# Patient Record
Sex: Female | Born: 1995 | Race: Black or African American | Hispanic: No | Marital: Single | Smoking: Current every day smoker
Health system: Southern US, Community
[De-identification: ages and names within clinical notes are randomized; demographics above are authoritative.]

## PROBLEM LIST (undated history)

## (undated) DIAGNOSIS — N83209 Unspecified ovarian cyst, unspecified side: Secondary | ICD-10-CM

## (undated) DIAGNOSIS — F419 Anxiety disorder, unspecified: Secondary | ICD-10-CM

## (undated) DIAGNOSIS — N730 Acute parametritis and pelvic cellulitis: Secondary | ICD-10-CM

---

## 2015-03-02 ENCOUNTER — Emergency Department (HOSPITAL_COMMUNITY): Payer: Self-pay

## 2015-03-02 ENCOUNTER — Encounter (HOSPITAL_COMMUNITY): Payer: Self-pay | Admitting: *Deleted

## 2015-03-02 ENCOUNTER — Emergency Department (HOSPITAL_COMMUNITY)
Admission: EM | Admit: 2015-03-02 | Discharge: 2015-03-02 | Disposition: A | Discharge status: Home / Self Care | Payer: Self-pay | Attending: Emergency Medicine | Admitting: Emergency Medicine

## 2015-03-02 DIAGNOSIS — N390 Urinary tract infection, site not specified: Secondary | ICD-10-CM | POA: Insufficient documentation

## 2015-03-02 DIAGNOSIS — R112 Nausea with vomiting, unspecified: Secondary | ICD-10-CM | POA: Insufficient documentation

## 2015-03-02 DIAGNOSIS — N730 Acute parametritis and pelvic cellulitis: Secondary | ICD-10-CM

## 2015-03-02 DIAGNOSIS — R102 Pelvic and perineal pain: Secondary | ICD-10-CM

## 2015-03-02 DIAGNOSIS — Z8659 Personal history of other mental and behavioral disorders: Secondary | ICD-10-CM | POA: Insufficient documentation

## 2015-03-02 DIAGNOSIS — N83201 Unspecified ovarian cyst, right side: Secondary | ICD-10-CM

## 2015-03-02 DIAGNOSIS — N8301 Follicular cyst of right ovary: Secondary | ICD-10-CM | POA: Insufficient documentation

## 2015-03-02 DIAGNOSIS — R1030 Lower abdominal pain, unspecified: Secondary | ICD-10-CM | POA: Insufficient documentation

## 2015-03-02 DIAGNOSIS — N739 Female pelvic inflammatory disease, unspecified: Secondary | ICD-10-CM | POA: Insufficient documentation

## 2015-03-02 DIAGNOSIS — Z3202 Encounter for pregnancy test, result negative: Secondary | ICD-10-CM | POA: Insufficient documentation

## 2015-03-02 DIAGNOSIS — Z720 Tobacco use: Secondary | ICD-10-CM | POA: Insufficient documentation

## 2015-03-02 DIAGNOSIS — R197 Diarrhea, unspecified: Secondary | ICD-10-CM | POA: Insufficient documentation

## 2015-03-02 HISTORY — DX: Anxiety disorder, unspecified: F41.9

## 2015-03-02 LAB — I-STAT BETA HCG BLOOD, ED (MC, WL, AP ONLY): I-stat hCG, quantitative: <5 m[IU]/mL (ref <5)

## 2015-03-02 LAB — URINALYSIS, ROUTINE W REFLEX MICROSCOPIC
Bilirubin Urine: NEGATIVE
Glucose, UA: NEGATIVE mg/dL
Hgb urine dipstick: NEGATIVE
KETONES UR: 15 mg/dL — AB
NITRITE: NEGATIVE
PROTEIN: NEGATIVE mg/dL
Specific Gravity, Urine: 1.027 (ref 1.005–1.030)
Urobilinogen, UA: 1 mg/dL (ref 0.0–1.0)
pH: 6.5 (ref 5.0–8.0)

## 2015-03-02 LAB — URINE MICROSCOPIC-ADD ON

## 2015-03-02 LAB — CBC
HCT: 44.9 % (ref 36.0–46.0)
HEMOGLOBIN: 15.1 g/dL — AB (ref 12.0–15.0)
MCH: 29.6 pg (ref 26.0–34.0)
MCHC: 33.6 g/dL (ref 30.0–36.0)
MCV: 88 fL (ref 78.0–100.0)
PLATELETS: 303 10*3/uL (ref 150–400)
RBC: 5.1 MIL/uL (ref 3.87–5.11)
RDW: 13.7 % (ref 11.5–15.5)
WBC: 7.8 10*3/uL (ref 4.0–10.5)

## 2015-03-02 LAB — COMPREHENSIVE METABOLIC PANEL
ALK PHOS: 68 U/L (ref 38–126)
ALT: 25 U/L (ref 14–54)
ANION GAP: 8 (ref 5–15)
AST: 27 U/L (ref 15–41)
Albumin: 4 g/dL (ref 3.5–5.0)
BUN: <5 mg/dL — ABNORMAL LOW (ref 6–20)
CALCIUM: 9.3 mg/dL (ref 8.9–10.3)
CHLORIDE: 107 mmol/L (ref 101–111)
CO2: 24 mmol/L (ref 22–32)
CREATININE: 0.78 mg/dL (ref 0.44–1.00)
Glucose, Bld: 111 mg/dL — ABNORMAL HIGH (ref 65–99)
Potassium: 3.8 mmol/L (ref 3.5–5.1)
SODIUM: 139 mmol/L (ref 135–145)
Total Bilirubin: 0.5 mg/dL (ref 0.3–1.2)
Total Protein: 7.5 g/dL (ref 6.5–8.1)

## 2015-03-02 LAB — WET PREP, GENITAL
Clue Cells Wet Prep HPF POC: NONE SEEN
TRICH WET PREP: NONE SEEN
Yeast Wet Prep HPF POC: NONE SEEN

## 2015-03-02 LAB — LIPASE, BLOOD: LIPASE: 36 U/L (ref 11–51)

## 2015-03-02 MED ORDER — DOXYCYCLINE HYCLATE 100 MG PO CAPS: 100.0000 mg | ORAL_CAPSULE | Freq: Two times a day (BID) | ORAL | Status: DC

## 2015-03-02 MED ORDER — AZITHROMYCIN 250 MG PO TABS
1000.0000 mg | ORAL_TABLET | Freq: Once | ORAL | Status: AC
  Administered 2015-03-02: 1000 mg via ORAL
  Filled 2015-03-02: qty 4

## 2015-03-02 MED ORDER — CEFTRIAXONE SODIUM 1 G IJ SOLR
1.0000 g | Freq: Once | INTRAMUSCULAR | Status: AC
  Administered 2015-03-02: 1 g via INTRAVENOUS
  Filled 2015-03-02: qty 10

## 2015-03-02 MED ORDER — NAPROXEN 500 MG PO TABS: 500.0000 mg | ORAL_TABLET | Freq: Two times a day (BID) | ORAL | Status: DC | PRN

## 2015-03-02 MED ORDER — SODIUM CHLORIDE 0.9 % IV BOLUS (SEPSIS)
1000.0000 mL | Freq: Once | INTRAVENOUS | Status: AC
Start: 2015-03-02 — End: 2015-03-02
  Administered 2015-03-02: 1000 mL via INTRAVENOUS

## 2015-03-02 MED ORDER — ONDANSETRON HCL 8 MG PO TABS: 8.0000 mg | ORAL_TABLET | Freq: Three times a day (TID) | ORAL | Status: DC | PRN

## 2015-03-02 MED ORDER — MORPHINE SULFATE (PF) 4 MG/ML IV SOLN
4.0000 mg | Freq: Once | INTRAVENOUS | Status: AC
  Administered 2015-03-02: 4 mg via INTRAVENOUS
  Filled 2015-03-02: qty 1

## 2015-03-02 MED ORDER — CEPHALEXIN 500 MG PO CAPS: ORAL_CAPSULE | ORAL | Status: DC

## 2015-03-02 MED ORDER — HYDROCODONE-ACETAMINOPHEN 5-325 MG PO TABS: 1.0000 | ORAL_TABLET | Freq: Four times a day (QID) | ORAL | Status: DC | PRN

## 2015-03-02 MED ORDER — ONDANSETRON HCL 4 MG/2ML IJ SOLN
4.0000 mg | Freq: Once | INTRAMUSCULAR | Status: AC
  Administered 2015-03-02: 4 mg via INTRAVENOUS
  Filled 2015-03-02: qty 2

## 2015-03-02 NOTE — ED Notes (Signed)
Pt is requesting pain medication. [place], [place] informed.

## 2015-03-02 NOTE — ED Notes (Signed)
Pt reports left side pain x 3 weeks, having urinary frequency and chills.

## 2015-03-02 NOTE — Discharge Instructions (Signed)
Your work up revealed an ovarian cyst on the right ovary which could be causing your symptoms. Take naprosyn and norco as directed as needed for pain but don't drive while taking norco. Use zofran as directed as needed for nausea. Stay well hydrated. Your urine appears to have a possible infection, take keflex as directed for this. Your vaginal exam revealed discharge and tenderness, you are being treated for pelvic inflammatory disease, take doxycycline as directed. You have been treated for gonorrhea and chlamydia in the ER but the hospital will call you if lab is positive. You were tested for HIV and Syphilis, and the hospital will call you if the lab is positive. Always use protection when engaging in intercourse. Follow up with [facility] in 1 week for recheck of symptoms and ongoing medical care, and [facility] for ongoing management of your ovarian cyst. Return to the ER for changes or worsening symptoms.  Abdominal (belly) pain can be caused by many things. Your caregiver performed an examination and possibly ordered blood/urine tests and imaging (CT scan, x-rays, ultrasound). Many cases can be observed and treated at home after initial evaluation in the emergency department. Even though you are being discharged home, abdominal pain can be unpredictable. Therefore, you need a repeated exam if your pain does not resolve, returns, or worsens. Most patients with abdominal pain don't have to be admitted to the hospital or have surgery, but serious problems like appendicitis and gallbladder attacks can start out as nonspecific pain. Many abdominal conditions cannot be diagnosed in one visit, so follow-up evaluations are very important. SEEK IMMEDIATE MEDICAL ATTENTION IF YOU DEVELOP ANY OF THE FOLLOWING SYMPTOMS:  The pain does not go away or becomes severe.   A temperature above 101 develops.   Repeated vomiting occurs (multiple episodes).   The pain  becomes localized to portions of the abdomen. The right side could possibly be appendicitis. In an adult, the left lower portion of the abdomen could be colitis or diverticulitis.   Blood is being passed in stools or vomit (bright red or black tarry stools).   Return also if you develop chest pain, difficulty breathing, dizziness or fainting, or become confused, poorly responsive, or inconsolable (young children).  The constipation stays for more than 4 days.   There is belly (abdominal) or rectal pain.   You do not seem to be getting better.    Stay very well hydrated with plenty of water throughout the day. Take antibiotic until completed. May consider over-the-counter Pyridium for pain relief, but don't take this longer than 3 days, and be aware that it may turn your urine bright orange. This is a harmless side effect. Follow up with primary care physician in 1 week for recheck of ongoing symptoms but return to ER for emergent changing or worsening of symptoms. Please seek immediate care if you develop the following: You develop back pain.  Your symptoms are no better, or worse in 3 days. There is severe back pain or lower abdominal pain.  You develop chills.  You have a fever.  There is nausea or vomiting.  There is continued burning or discomfort with urination.     Abdominal Pain, Adult Many things can cause belly (abdominal) pain. Most times, the belly pain is not dangerous. Many cases of belly pain can be watched and treated at home. HOME CARE   Do not take medicines that help you go poop (laxatives) unless told to  by your doctor.  Only take medicine as told by your doctor.  Eat or drink as told by your doctor. Your doctor will tell you if you should be on a special diet. GET HELP IF:  You do not know what is causing your belly pain.  You have belly pain while you are sick to your stomach (nauseous) or have runny poop (diarrhea).  You have pain while you pee or  poop.  Your belly pain wakes you up at night.  You have belly pain that gets worse or better when you eat.  You have belly pain that gets worse when you eat fatty foods.  You have a fever. GET HELP RIGHT AWAY IF:   The pain does not go away within 2 hours.  You keep throwing up (vomiting).  The pain changes and is only in the right or left part of the belly.  You have bloody or tarry looking poop. MAKE SURE YOU:   Understand these instructions.  Will watch your condition.  Will get help right away if you are not doing well or get worse.   This information is not intended to replace advice given to you by your health care provider. Make sure you discuss any questions you have with your health care provider.   Document Released: 10/01/2007 Document Revised: 05/05/2014 Document Reviewed: 12/22/2012 Elsevier Interactive Patient Education 2016 [employer] Inc.  Nausea and Vomiting Nausea means you feel sick to your stomach. Throwing up (vomiting) is a reflex where stomach contents come out of your mouth. HOME CARE   Take medicine as told by your doctor.  Do not force yourself to eat. However, you do need to drink fluids.  If you feel like eating, eat a normal diet as told by your doctor.  Eat rice, wheat, potatoes, bread, lean meats, yogurt, fruits, and vegetables.  Avoid high-fat foods.  Drink enough fluids to keep your pee (urine) clear or pale yellow.  Ask your doctor how to replace body fluid losses (rehydrate). Signs of body fluid loss (dehydration) include:  Feeling very thirsty.  Dry lips and mouth.  Feeling dizzy.  Dark pee.  Peeing less than normal.  Feeling confused.  Fast breathing or heart rate. GET HELP RIGHT AWAY IF:   You have blood in your throw up.  You have black or bloody poop (stool).  You have a bad headache or stiff neck.  You feel confused.  You have bad belly (abdominal) pain.  You have chest pain or trouble breathing.  You  do not pee at least once every 8 hours.  You have cold, clammy skin.  You keep throwing up after 24 to 48 hours.  You have a fever. MAKE SURE YOU:   Understand these instructions.  Will watch your condition.  Will get help right away if you are not doing well or get worse.   This information is not intended to replace advice given to you by your health care provider. Make sure you discuss any questions you have with your health care provider.   Document Released: 10/01/2007 Document Revised: 07/07/2011 Document Reviewed: 09/13/2010 Elsevier Interactive Patient Education 2016 [employer].  Food Choices to Help Relieve Diarrhea, Adult When you have diarrhea, the foods you eat and your eating habits are very important. Choosing the right foods and drinks can help relieve diarrhea. Also, because diarrhea can last up to 7 days, you need to replace lost fluids and electrolytes (such as sodium, potassium, and chloride) in order to  help prevent dehydration.  WHAT GENERAL GUIDELINES DO I NEED TO FOLLOW?  Slowly drink 1 cup (8 oz) of fluid for each episode of diarrhea. If you are getting enough fluid, your urine will be clear or pale yellow.  Eat starchy foods. Some good choices include white rice, white toast, pasta, low-fiber cereal, baked potatoes (without the skin), saltine crackers, and bagels.  Avoid large servings of any cooked vegetables.  Limit fruit to two servings per day. A serving is  cup or 1 small piece.  Choose foods with less than 2 g of fiber per serving.  Limit fats to less than 8 tsp (38 g) per day.  Avoid fried foods.  Eat foods that have probiotics in them. Probiotics can be found in certain dairy products.  Avoid foods and beverages that may increase the speed at which food moves through the stomach and intestines (gastrointestinal tract). Things to avoid include:  High-fiber foods, such as dried fruit, raw fruits and vegetables, nuts, seeds, and whole grain  foods.  Spicy foods and high-fat foods.  Foods and beverages sweetened with high-fructose corn syrup, honey, or sugar alcohols such as xylitol, sorbitol, and mannitol. WHAT FOODS ARE RECOMMENDED? Grains White rice. White, [place], or pita breads (fresh or toasted), including plain rolls, buns, or bagels. White pasta. Saltine, soda, or graham crackers. Pretzels. Low-fiber cereal. Cooked cereals made with water (such as cornmeal, farina, or cream cereals). Plain muffins. Matzo. Melba toast. Zwieback.  Vegetables Potatoes (without the skin). Strained tomato and vegetable juices. Most well-cooked and canned vegetables without seeds. Tender lettuce. Fruits Cooked or canned applesauce, apricots, cherries, fruit cocktail, grapefruit, peaches, pears, or plums. Fresh bananas, apples without skin, cherries, grapes, cantaloupe, grapefruit, peaches, oranges, or plums.  Meat and Other Protein Products Baked or boiled chicken. Eggs. Tofu. Fish. Seafood. Smooth peanut butter. Ground or well-cooked tender beef, ham, veal, lamb, pork, or poultry.  Dairy Plain yogurt, kefir, and unsweetened liquid yogurt. Lactose-free milk, buttermilk, or soy milk. Plain hard cheese. Beverages Sport drinks. Clear broths. Diluted fruit juices (except prune). Regular, caffeine-free sodas such as ginger ale. Water. Decaffeinated teas. Oral rehydration solutions. Sugar-free beverages not sweetened with sugar alcohols. Other Bouillon, broth, or soups made from recommended foods.  The items listed above may not be a complete list of recommended foods or beverages. Contact your dietitian for more options. WHAT FOODS ARE NOT RECOMMENDED? Grains Whole grain, whole wheat, bran, or rye breads, rolls, pastas, crackers, and cereals. Wild or brown rice. Cereals that contain more than 2 g of fiber per serving. Corn tortillas or taco shells. Cooked or dry oatmeal. Granola. Popcorn. Vegetables Raw vegetables. Cabbage, broccoli, Brussels  sprouts, artichokes, baked beans, beet greens, corn, kale, legumes, peas, sweet potatoes, and yams. Potato skins. Cooked spinach and cabbage. Fruits Dried fruit, including raisins and dates. Raw fruits. Stewed or dried prunes. Fresh apples with skin, apricots, mangoes, pears, raspberries, and strawberries.  Meat and Other Protein Products Chunky peanut butter. Nuts and seeds. Beans and lentils. Tiffany Pennington.  Dairy High-fat cheeses. Milk, chocolate milk, and beverages made with milk, such as milk shakes. Cream. Ice cream. Sweets and Desserts Sweet rolls, doughnuts, and sweet breads. Pancakes and waffles. Fats and Oils Butter. Cream sauces. Margarine. Salad oils. Plain salad dressings. Olives. Avocados.  Beverages Caffeinated beverages (such as coffee, tea, soda, or energy drinks). Alcoholic beverages. Fruit juices with pulp. Prune juice. Soft drinks sweetened with high-fructose corn syrup or sugar alcohols. Other Coconut. Hot sauce. Chili powder. Mayonnaise. Gravy. Cream-based or  milk-based soups.  The items listed above may not be a complete list of foods and beverages to avoid. Contact your dietitian for more information. WHAT SHOULD I DO IF I BECOME DEHYDRATED? Diarrhea can sometimes lead to dehydration. Signs of dehydration include dark urine and dry mouth and skin. If you think you are dehydrated, you should rehydrate with an oral rehydration solution. These solutions can be purchased at pharmacies, retail stores, or online.  Drink -1 cup (120-240 mL) of oral rehydration solution each time you have an episode of diarrhea. If drinking this amount makes your diarrhea worse, try drinking smaller amounts more often. For example, drink 1-3 tsp (5-15 mL) every 5-10 minutes.  A general rule for staying hydrated is to drink 1-2 L of fluid per day. Talk to your health care provider about the specific amount you should be drinking each day. Drink enough fluids to keep your urine clear or pale yellow.    This information is not intended to replace advice given to you by your health care provider. Make sure you discuss any questions you have with your health care provider.   Document Released: 07/05/2003 Document Revised: 05/05/2014 Document Reviewed: 03/07/2013 Elsevier Interactive Patient Education 2016 Elsevier Inc.  Pelvic Inflammatory Disease Pelvic inflammatory disease (PID) refers to an infection in some or all of the female organs. The infection can be in the uterus, ovaries, fallopian tubes, or the surrounding tissues in the pelvis. PID can cause abdominal or pelvic pain that comes on suddenly (acute pelvic pain). PID is a serious infection because it can lead to lasting (chronic) pelvic pain or the inability to have children (infertility). CAUSES This condition is most often caused by an infection that is spread during sexual contact. However, the infection can also be caused by the normal bacteria that are found in the vaginal tissues if these bacteria travel upward into the reproductive organs. PID can also occur following:  The birth of a baby.  A miscarriage.  An abortion.  Major pelvic surgery.  The use of an intrauterine device (IUD).  A sexual assault. RISK FACTORS This condition is more likely to develop in women who:  Are younger than 20 years of age.  [facility].  Use nonbarrier contraception.  Have multiple sexual partners.  Have sex with someone who has symptoms of an STD (sexually transmitted disease).  Use oral contraception. At times, certain behaviors can also increase the possibility of getting PID, such as:  Using a vaginal douche.  Having an IUD in place. SYMPTOMS Symptoms of this condition include:  Abdominal or pelvic pain.  Fever.  Chills.  Abnormal vaginal discharge.  Abnormal uterine bleeding.  Unusual pain shortly after the end of a menstrual period.  Painful urination.  Pain with sexual  intercourse.  Nausea and vomiting. DIAGNOSIS To diagnose this condition, your health care provider will do a physical exam and take your medical history. A pelvic exam typically reveals great tenderness in the uterus and the surrounding pelvic tissues. You may also have tests, such as:  Lab tests, including a pregnancy test, blood tests, and urine test.  Culture tests of the vagina and cervix to check for an STD.  Ultrasound.  A laparoscopic procedure to look inside the pelvis.  Examining vaginal secretions under a microscope. TREATMENT Treatment for this condition may involve one or more approaches.  Antibiotic medicines may be prescribed to be taken by mouth.  Sexual partners may need to be treated if the  infection is caused by an STD.  For more severe cases, hospitalization may be needed to give antibiotics directly into a vein through an IV tube.  Surgery may be needed if other treatments do not help, but this is rare. It may take weeks until you are completely well. If you are diagnosed with PID, you should also be checked for human immunodeficiency virus (HIV). Your health care provider may test you for infection again 3 months after treatment. You should not have unprotected sex. HOME CARE INSTRUCTIONS  Take over-the-counter and prescription medicines only as told by your health care provider.  If you were prescribed an antibiotic medicine, take it as told by your health care provider. Do not stop taking the antibiotic even if you start to feel better.  Do not have sexual intercourse until treatment is completed or as told by your health care provider. If PID is confirmed, your recent sexual partners will need treatment, especially if you had unprotected sex.  Keep all follow-up visits as told by your health care provider. This is important. SEEK MEDICAL CARE IF:  You have increased or abnormal vaginal discharge.  Your pain does not improve.  You vomit.  You have a  fever.  You cannot tolerate your medicines.  Your partner has an STD.  You have pain when you urinate. SEEK IMMEDIATE MEDICAL CARE IF:  You have increased abdominal or pelvic pain.  You have chills.  Your symptoms are not better in 72 hours even with treatment.   This information is not intended to replace advice given to you by your health care provider. Make sure you discuss any questions you have with your health care provider.   Document Released: 04/14/2005 Document Revised: 01/03/2015 Document Reviewed: 05/22/2014 Elsevier Interactive Patient Education 2016 Else[employer] Inc.  Urinary Tract Infection A urinary tract infection (UTI) can occur any place along the urinary tract. The tract includes the kidneys, ureters, bladder, and urethra. A type of germ called bacteria often causes a UTI. UTIs are often helped with antibiotic medicine.  HOME CARE   If given, take antibiotics as told by your doctor. Finish them even if you start to feel better.  Drink enough fluids to keep your pee (urine) clear or pale yellow.  Avoid tea, drinks with caffeine, and bubbly (carbonated) drinks.  Pee often. Avoid holding your pee in for a long time.  Pee before and after having sex (intercourse).  Wipe from front to back after you poop (bowel movement) if you are a woman. Use each tissue only once. GET HELP RIGHT AWAY IF:   You have back pain.  You have lower belly (abdominal) pain.  You have chills.  You feel sick to your stomach (nauseous).  You throw up (vomit).  Your burning or discomfort with peeing does not go away.  You have a fever.  Your symptoms are not better in 3 days. MAKE SURE YOU:   Understand these instructions.  Will watch your condition.  Will get help right away if you are not doing well or get worse.   This information is not intended to replace advice given to you by your health care provider. Make sure you discuss any questions you have with your health  care provider.   Document Released: 10/01/2007 Document Revised: 05/05/2014 Document Reviewed: 11/13/2011 Elsevier Interactive Patient Education [employer]

## 2015-03-02 NOTE — ED Notes (Signed)
Pt unable to obtain urine sample at triage. 

## 2015-03-02 NOTE — ED Provider Notes (Signed)
CSN: [id number]     Arrival date & time 03/02/15  1224 History   First MD Initiated Contact with Patient 03/02/15 1458     Chief Complaint  Patient presents with  . Abdominal Pain  . Urinary Frequency     (Consider location/radiation/quality/duration/timing/severity/associated sxs/prior Treatment) HPI Comments: Tiffany Pennington is a 19 y.o. female with a PMHx of anxiety and L ovarian cyst, who presents to the ED with complaints of 3 weeks of gradual onset lower abdominal pain. She reports the pain is 10/10 constant waxing and waning cramping worse in the left lower quadrant and suprapubic area, but somewhat in the right lower quadrant as well, radiating to her L lower back, worse with movement, and unrelieved with Aleve, ibuprofen, and Tylenol. Associated symptoms include chills, dysuria, urinary frequency and urgency. She also reports nausea, one episode of nonbloody nonbilious emesis, and one episode of watery nonbloody diarrhea last night. She endorses chronic NSAID use. She states that her LMP was 1 year ago due to the fact that she had Nexplanon placed, states that she had this done because she had severe cramping with her menses. She is sexually active with one female partner, unprotected.  She denies any fevers, chest pain, shortness breath, hematemesis, hematochezia, melena, constipation, obstipation, hematuria, vaginal bleeding or discharge, numbness, tingling, weakness, recent travel, suspicious food intake, sick contacts, recent antibiotics, alcohol use, or prior abdominal surgeries. She states this pain is different than her prior abd cramping with menses.  Patient is a 19 y.o. female presenting with abdominal pain and frequency. The history is provided by the patient. No language interpreter was used.  Abdominal Pain Pain location:  LLQ, suprapubic and RLQ Pain quality: cramping   Pain radiates to:  Back Pain severity:  Severe Onset quality:  Gradual Duration:  3 weeks Timing:   Constant Progression:  Waxing and waning Chronicity:  New Context: not recent travel, not sick contacts and not suspicious food intake   Relieved by:  Nothing Worsened by:  Movement Ineffective treatments:  Acetaminophen and NSAIDs Associated symptoms: chills, diarrhea, dysuria, nausea and vomiting   Associated symptoms: no chest pain, no constipation, no fever, no hematuria, no shortness of breath, no vaginal bleeding and no vaginal discharge   Risk factors: NSAID use   Risk factors: no alcohol abuse   Urinary Frequency Associated symptoms include abdominal pain, chills, nausea and vomiting. Pertinent negatives include no arthralgias, chest pain, fever, myalgias, numbness or weakness.    Past Medical History  Diagnosis Date  . Anxiety    No past surgical history on file. No family history on file. Social History  Substance Use Topics  . Smoking status: Current Every Day Smoker    Types: Cigarettes  . Smokeless tobacco: Not on file  . Alcohol Use: No   OB History    No data available     Review of Systems  Constitutional: Positive for chills. Negative for fever.  Respiratory: Negative for shortness of breath.   Cardiovascular: Negative for chest pain.  Gastrointestinal: Positive for nausea, vomiting, abdominal pain and diarrhea. Negative for constipation and blood in stool.  Genitourinary: Positive for dysuria, urgency and frequency. Negative for hematuria, flank pain, vaginal bleeding and vaginal discharge.  Musculoskeletal: Positive for back pain. Negative for myalgias and arthralgias.  Skin: Negative for color change.  Allergic/Immunologic: Negative for immunocompromised state.  Neurological: Negative for weakness and numbness.  Psychiatric/Behavioral: Negative for confusion.   10 Systems reviewed and are negative for acute change except as noted  in the HPI.    Allergies  Review of patient's allergies indicates no known allergies.  Home Medications   Prior to  Admission medications   Not on File   BP 147/57 mmHg  Pulse 74  Temp(Src) 98.3 F (36.8 C) (Oral)  Resp 18  SpO2 100% Physical Exam  Constitutional: She is oriented to person, place, and time. Vital signs are normal. She appears well-developed and well-nourished.  Non-toxic appearance. No distress.  Afebrile, nontoxic, NAD  HENT:  Head: Normocephalic and atraumatic.  Mouth/Throat: Oropharynx is clear and moist and mucous membranes are normal.  Eyes: Conjunctivae and EOM are normal. Right eye exhibits no discharge. Left eye exhibits no discharge.  Neck: Normal range of motion. Neck supple.  Cardiovascular: Normal rate, regular rhythm, normal heart sounds and intact distal pulses.  Exam reveals no gallop and no friction rub.   No murmur heard. Pulmonary/Chest: Effort normal and breath sounds normal. No respiratory distress. She has no decreased breath sounds. She has no wheezes. She has no rhonchi. She has no rales.  Abdominal: Soft. Normal appearance and bowel sounds are normal. She exhibits no distension. There is tenderness in the right lower quadrant, suprapubic area and left lower quadrant. There is no rigidity, no rebound, no guarding, no CVA tenderness, no tenderness at McBurney's point and negative Murphy's sign.    Soft, nondistended, +BS throughout, with diffuse lower abd TTP near pelvic brim, no r/g/r, neg murphy's, neg mcburney's, no CVA TTP   Genitourinary: Uterus normal. Pelvic exam was performed with patient supine. There is no rash, tenderness or lesion on the right labia. There is no rash, tenderness or lesion on the left labia. Cervix exhibits motion tenderness, discharge and friability. Right adnexum displays tenderness. Right adnexum displays no mass and no fullness. Left adnexum displays tenderness and fullness. Left adnexum displays no mass. No erythema or bleeding in the vagina. Vaginal discharge found.  Chaperone present for exam. No rashes, lesions, or tenderness to  external genitalia. No erythema, injury, or tenderness to vaginal mucosa. +Mucoid vaginal discharge without bleeding within vaginal vault. No adnexal masses, but with b/l adnexal tenderness and L sided adnexal fullness. +CMT, cervical friability, and mucoid discharge from cervical os. Uterus non-deviated, mobile, nonTTP, and without enlargement.    Musculoskeletal: Normal range of motion.  Neurological: She is alert and oriented to person, place, and time. She has normal strength. No sensory deficit.  Skin: Skin is warm, dry and intact. No rash noted.  Psychiatric: She has a normal mood and affect.  Nursing note and vitals reviewed.   ED Course  Procedures (including critical care time) Labs Review Labs Reviewed  WET PREP, GENITAL - Abnormal; Notable for the following:    WBC, Wet Prep HPF POC MANY (*)    All other components within normal limits  COMPREHENSIVE METABOLIC PANEL - Abnormal; Notable for the following:    Glucose, Bld 111 (*)    BUN <5 (*)    All other components within normal limits  CBC - Abnormal; Notable for the following:    Hemoglobin 15.1 (*)    All other components within normal limits  URINALYSIS, ROUTINE W REFLEX MICROSCOPIC (NOT AT [facility]) - Abnormal; Notable for the following:    APPearance CLOUDY (*)    Ketones, ur 15 (*)    Leukocytes, UA SMALL (*)    All other components within normal limits  URINE MICROSCOPIC-ADD ON - Abnormal; Notable for the following:    Squamous Epithelial / LPF MANY (*)  Bacteria, UA MANY (*)    All other components within normal limits  LIPASE, BLOOD  RPR  HIV ANTIBODY (ROUTINE TESTING)  I-STAT BETA HCG BLOOD, ED ([facility], [facility], [facility] ONLY)  GC/CHLAMYDIA PROBE AMP ([place]) NOT AT [facility]    Imaging Review US Transvaginal Non-ob  03/02/2015  CLINICAL DATA:  Left adnexal tenderness. EXAM: TRANSABDOMINAL AND TRANSVAGINAL ULTRASOUND OF PELVIS DOPPLER ULTRASOUND OF OVARIES TECHNIQUE: Both transabdominal and transvaginal ultrasound  examinations of the pelvis were performed. Transabdominal technique was performed for global imaging of the pelvis including uterus, ovaries, adnexal regions, and pelvic cul-de-sac. It was necessary to proceed with endovaginal exam following the transabdominal exam to visualize the uterus, endometrium and ovaries. Color and duplex Doppler ultrasound was utilized to evaluate blood flow to the ovaries. COMPARISON:  None. FINDINGS: Uterus Measurements: 6.9 x 3.1 x 4.0 cm. No fibroids or other mass visualized. Endometrium Thickness: 4.0 mm.  No focal abnormality visualized. Right ovary Measurements: 5.3 x 3.1 x 2.5 cm. Cyst measures 3.9 x 2.6 x 2.2 cm. No septation or mural nodularity. No adnexal mass. Left ovary Measurements: 3.6 x 1.7 x 2.7 cm. Normal appearance/no adnexal mass. Pulsed Doppler evaluation of both ovaries demonstrates normal low-resistance arterial and venous waveforms. Other findings Moderate free fluid identified within the pelvis. IMPRESSION: 1. No evidence for ovarian torsion. 2. Right ovary cyst which measures 3.9 cm. This is almost certainly benign, and no specific imaging follow up is recommended according to the Society of Radiologists in Ultrasound2010 Consensus Conference Statement (D Lenis Noon et al. Management of Asymptomatic Ovarian and Other Adnexal Cysts Imaged at [place]: Society of Radiologists in Ultrasound Consensus Conference Statement 2010. Radiology 256 (Sept 2010): 943-954.). 3. Moderate free fluid identified within the pelvis. Electronically Signed   By: Signa Kell M.D.   On: 03/02/2015 18:27   US Pelvis Complete  03/02/2015  CLINICAL DATA:  Left adnexal tenderness. EXAM: TRANSABDOMINAL AND TRANSVAGINAL ULTRASOUND OF PELVIS DOPPLER ULTRASOUND OF OVARIES TECHNIQUE: Both transabdominal and transvaginal ultrasound examinations of the pelvis were performed. Transabdominal technique was performed for global imaging of the pelvis including uterus, ovaries, adnexal regions, and pelvic  cul-de-sac. It was necessary to proceed with endovaginal exam following the transabdominal exam to visualize the uterus, endometrium and ovaries. Color and duplex Doppler ultrasound was utilized to evaluate blood flow to the ovaries. COMPARISON:  None. FINDINGS: Uterus Measurements: 6.9 x 3.1 x 4.0 cm. No fibroids or other mass visualized. Endometrium Thickness: 4.0 mm.  No focal abnormality visualized. Right ovary Measurements: 5.3 x 3.1 x 2.5 cm. Cyst measures 3.9 x 2.6 x 2.2 cm. No septation or mural nodularity. No adnexal mass. Left ovary Measurements: 3.6 x 1.7 x 2.7 cm. Normal appearance/no adnexal mass. Pulsed Doppler evaluation of both ovaries demonstrates normal low-resistance arterial and venous waveforms. Other findings Moderate free fluid identified within the pelvis. IMPRESSION: 1. No evidence for ovarian torsion. 2. Right ovary cyst which measures 3.9 cm. This is almost certainly benign, and no specific imaging follow up is recommended according to the Society of Radiologists in Ultrasound2010 Consensus Conference Statement (D Lenis Noon et al. Management of Asymptomatic Ovarian and Other Adnexal Cysts Imaged at [place]: Society of Radiologists in Ultrasound Consensus Conference Statement 2010. Radiology 256 (Sept 2010): 943-954.). 3. Moderate free fluid identified within the pelvis. Electronically Signed   By: Signa Kell M.D.   On: 03/02/2015 18:27   [place] Art/ven Flow Abd Pelv Doppler  03/02/2015  CLINICAL DATA:  Left adnexal tenderness. EXAM: TRANSABDOMINAL AND TRANSVAGINAL ULTRASOUND OF  PELVIS DOPPLER ULTRASOUND OF OVARIES TECHNIQUE: Both transabdominal and transvaginal ultrasound examinations of the pelvis were performed. Transabdominal technique was performed for global imaging of the pelvis including uterus, ovaries, adnexal regions, and pelvic cul-de-sac. It was necessary to proceed with endovaginal exam following the transabdominal exam to visualize the uterus, endometrium and ovaries. Color and  duplex Doppler ultrasound was utilized to evaluate blood flow to the ovaries. COMPARISON:  None. FINDINGS: Uterus Measurements: 6.9 x 3.1 x 4.0 cm. No fibroids or other mass visualized. Endometrium Thickness: 4.0 mm.  No focal abnormality visualized. Right ovary Measurements: 5.3 x 3.1 x 2.5 cm. Cyst measures 3.9 x 2.6 x 2.2 cm. No septation or mural nodularity. No adnexal mass. Left ovary Measurements: 3.6 x 1.7 x 2.7 cm. Normal appearance/no adnexal mass. Pulsed Doppler evaluation of both ovaries demonstrates normal low-resistance arterial and venous waveforms. Other findings Moderate free fluid identified within the pelvis. IMPRESSION: 1. No evidence for ovarian torsion. 2. Right ovary cyst which measures 3.9 cm. This is almost certainly benign, and no specific imaging follow up is recommended according to the Society of Radiologists in Ultrasound2010 Consensus Conference Statement (D Lenis NoonLevine et al. Management of Asymptomatic Ovarian and Other Adnexal Cysts Imaged at [place]: Society of Radiologists in Ultrasound Consensus Conference Statement 2010. Radiology 256 (Sept 2010): [phone].). 3. Moderate free fluid identified within the pelvis. Electronically Signed   By: Signa Kellaylor  Stroud M.D.   On: 03/02/2015 18:27   I have personally reviewed and evaluated these images and lab results as part of my medical decision-making.   EKG Interpretation None      MDM   Final diagnoses:  Lower abdominal pain  Nausea vomiting and diarrhea  Cyst of right ovary  UTI (lower urinary tract infection)  PID (acute pelvic inflammatory disease)    19 y.o. female here with lower abd pain/cramping x3 wks, and urinary freq/urgency and dysuria, some chills, had n/v/d last night. Hx of ovarian cysts on L ovary. BetaHCG neg, lipase and CMP WNL, CBC unremarkable. Still haven't gotten U/A. On exam, mild diffuse lower abd tenderness, no flank tenderness. Will obtain pelvic, STD check, and wet prep. Could potentially proceed with pelvic  u/s depending on pelvic exam findings. Will give pain meds, nausea meds, and fluids, then reassess.   3:45 PM Pelvic exam reveals mild mucoid discharge in vault, coming from cervix which is friable and has +CMT, b/l adnexal tenderness L>R with some L adnexal fullness. Will obtain pelvic ultrasound to further evaluate pelvic pain. Will empirically treat for GC/CT now. Will reassess after wet prep/UA result, and U/S results. Will likely d/c home with PID tx.  6:06 PM Wet prep with many WBCs. U/A with small leuks, many squamous, 21-50 WBC, and many bacteria, could be contaminant but given her symptoms of dysuria/urinary frequency, will treat as UTI. U/S pending. Pt requesting more pain medications, will give another round of morphine. Will reassess after U/S results.  6:32 PM Pain improving, nausea resolved, tolerating PO well here. U/S with R ovarian cyst measuring 3.9cm with moderate free fluid in pelvis. Could explain her symptoms, although doesn't explain her L adnexal tenderness. Will treat empirically for UTI and PID. Will send home with nausea and pain meds as well, and have her f/up with [facility] in 1wk for recheck as well [facility] on ovarian cyst. I explained the diagnosis and have given explicit precautions to return to the ER including for any other new or worsening symptoms. The patient  understands and accepts the medical plan as it's been dictated and I have answered their questions. Discharge instructions concerning home care and prescriptions have been given. The patient is STABLE and is discharged to home in good condition.  BP 109/55 mmHg  Pulse 65  Temp(Src) 98.3 F (36.8 C) (Oral)  Resp 18  SpO2 94%  LMP  (Approximate)  Meds ordered this encounter  Medications  . sodium chloride 0.9 % bolus 1,000 mL    Sig:   . ondansetron (ZOFRAN) injection 4 mg    Sig:   . morphine 4 MG/ML injection 4 mg    Sig:   . azithromycin (ZITHROMAX) tablet 1,000  mg    Sig:   . cefTRIAXone (ROCEPHIN) 1 g in dextrose 5 % 50 mL IVPB    Sig:   . morphine 4 MG/ML injection 4 mg    Sig:   . naproxen (NAPROSYN) 500 MG tablet    Sig: Take 1 tablet (500 mg total) by mouth 2 (two) times daily as needed for mild pain, moderate pain or headache (TAKE WITH MEALS.).    Dispense:  20 tablet    Refill:  0    Order Specific Question:  Supervising Provider    Answer:  MILLER, BRIAN [[id number]]  . HYDROcodone-acetaminophen (NORCO) 5-325 MG tablet    Sig: Take 1 tablet by mouth every 6 (six) hours as needed for severe pain.    Dispense:  10 tablet    Refill:  0    Order Specific Question:  Supervising Provider    Answer:  Hyacinth Meeker, BRIAN [[id number]]  . ondansetron (ZOFRAN) 8 MG tablet    Sig: Take 1 tablet (8 mg total) by mouth every 8 (eight) hours as needed for nausea or vomiting.    Dispense:  10 tablet    Refill:  0    Order Specific Question:  Supervising Provider    Answer:  Hyacinth Meeker, BRIAN [[id number]]  . doxycycline (VIBRAMYCIN) 100 MG capsule    Sig: Take 1 capsule (100 mg total) by mouth 2 (two) times daily. One po bid x 14 days    Dispense:  28 capsule    Refill:  0    Order Specific Question:  Supervising Provider    Answer:  MILLER, BRIAN [[id number]]  . cephALEXin (KEFLEX) 500 MG capsule    Sig: 2 caps po bid x 7 days    Dispense:  28 capsule    Refill:  0    Order Specific Question:  Supervising Provider    Answer:  Eber Hong [[id number]]     Caylor Cerino Camprubi-Soms, PA-C 03/02/15 1845  Blane Ohara, MD 03/03/15 [phone]

## 2015-03-02 NOTE — ED Notes (Signed)
Pt placed on monitor upon return to room. Pt monitored by blood pressure and pulse ox.  

## 2015-03-03 LAB — HIV ANTIBODY (ROUTINE TESTING W REFLEX): HIV Screen 4th Generation wRfx: NONREACTIVE

## 2015-03-03 LAB — RPR: RPR Ser Ql: NONREACTIVE

## 2015-03-04 ENCOUNTER — Emergency Department (HOSPITAL_COMMUNITY)
Admission: EM | Admit: 2015-03-04 | Discharge: 2015-03-04 | Disposition: A | Discharge status: Home / Self Care | Payer: Self-pay | Attending: Emergency Medicine | Admitting: Emergency Medicine

## 2015-03-04 ENCOUNTER — Emergency Department (HOSPITAL_COMMUNITY): Payer: Self-pay

## 2015-03-04 ENCOUNTER — Encounter (HOSPITAL_COMMUNITY): Payer: Self-pay | Admitting: Emergency Medicine

## 2015-03-04 DIAGNOSIS — Z79899 Other long term (current) drug therapy: Secondary | ICD-10-CM | POA: Insufficient documentation

## 2015-03-04 DIAGNOSIS — Z3202 Encounter for pregnancy test, result negative: Secondary | ICD-10-CM | POA: Insufficient documentation

## 2015-03-04 DIAGNOSIS — K8020 Calculus of gallbladder without cholecystitis without obstruction: Secondary | ICD-10-CM | POA: Insufficient documentation

## 2015-03-04 DIAGNOSIS — Z8742 Personal history of other diseases of the female genital tract: Secondary | ICD-10-CM | POA: Insufficient documentation

## 2015-03-04 DIAGNOSIS — R102 Pelvic and perineal pain: Secondary | ICD-10-CM

## 2015-03-04 DIAGNOSIS — Z720 Tobacco use: Secondary | ICD-10-CM | POA: Insufficient documentation

## 2015-03-04 DIAGNOSIS — F419 Anxiety disorder, unspecified: Secondary | ICD-10-CM | POA: Insufficient documentation

## 2015-03-04 HISTORY — DX: Unspecified ovarian cyst, unspecified side: N83.209

## 2015-03-04 HISTORY — DX: Acute parametritis and pelvic cellulitis: N73.0

## 2015-03-04 LAB — COMPREHENSIVE METABOLIC PANEL
ALT: 26 U/L (ref 14–54)
AST: 24 U/L (ref 15–41)
Albumin: 4 g/dL (ref 3.5–5.0)
Alkaline Phosphatase: 65 U/L (ref 38–126)
Anion gap: 12 (ref 5–15)
BUN: 7 mg/dL (ref 6–20)
CO2: 23 mmol/L (ref 22–32)
Calcium: 9.8 mg/dL (ref 8.9–10.3)
Chloride: 105 mmol/L (ref 101–111)
Creatinine, Ser: 0.73 mg/dL (ref 0.44–1.00)
GFR calc Af Amer: >60 mL/min (ref >60)
GFR calc non Af Amer: >60 mL/min (ref >60)
Glucose, Bld: 87 mg/dL (ref 65–99)
Potassium: 3.6 mmol/L (ref 3.5–5.1)
Sodium: 140 mmol/L (ref 135–145)
Total Bilirubin: 0.4 mg/dL (ref 0.3–1.2)
Total Protein: 7.1 g/dL (ref 6.5–8.1)

## 2015-03-04 LAB — URINALYSIS, ROUTINE W REFLEX MICROSCOPIC
Glucose, UA: NEGATIVE mg/dL
Hgb urine dipstick: NEGATIVE
Ketones, ur: 15 mg/dL — AB
Leukocytes, UA: NEGATIVE
Nitrite: NEGATIVE
Protein, ur: NEGATIVE mg/dL
Specific Gravity, Urine: 1.038 — ABNORMAL HIGH (ref 1.005–1.030)
Urobilinogen, UA: 1 mg/dL (ref 0.0–1.0)
pH: 6 (ref 5.0–8.0)

## 2015-03-04 LAB — CBC WITH DIFFERENTIAL/PLATELET
Basophils Absolute: 0 10*3/uL (ref 0.0–0.1)
Basophils Relative: 1 %
Eosinophils Absolute: 0.3 10*3/uL (ref 0.0–0.7)
Eosinophils Relative: 3 %
HCT: 43.2 % (ref 36.0–46.0)
Hemoglobin: 14.7 g/dL (ref 12.0–15.0)
Lymphocytes Relative: 37 %
Lymphs Abs: 3 10*3/uL (ref 0.7–4.0)
MCH: 29.6 pg (ref 26.0–34.0)
MCHC: 34 g/dL (ref 30.0–36.0)
MCV: 86.9 fL (ref 78.0–100.0)
Monocytes Absolute: 0.8 10*3/uL (ref 0.1–1.0)
Monocytes Relative: 11 %
Neutro Abs: 3.9 10*3/uL (ref 1.7–7.7)
Neutrophils Relative %: 48 %
Platelets: 284 10*3/uL (ref 150–400)
RBC: 4.97 MIL/uL (ref 3.87–5.11)
RDW: 13.5 % (ref 11.5–15.5)
WBC: 8 10*3/uL (ref 4.0–10.5)

## 2015-03-04 LAB — PREGNANCY, URINE: Preg Test, Ur: NEGATIVE

## 2015-03-04 MED ORDER — LORAZEPAM 2 MG/ML IJ SOLN
0.5000 mg | Freq: Once | INTRAMUSCULAR | Status: AC
  Administered 2015-03-04: 0.5 mg via INTRAVENOUS
  Filled 2015-03-04: qty 1

## 2015-03-04 MED ORDER — MORPHINE SULFATE (PF) 4 MG/ML IV SOLN
6.0000 mg | Freq: Once | INTRAVENOUS | Status: AC
  Administered 2015-03-04: 6 mg via INTRAVENOUS
  Filled 2015-03-04: qty 2

## 2015-03-04 MED ORDER — IOHEXOL 300 MG/ML  SOLN
100.0000 mL | Freq: Once | INTRAMUSCULAR | Status: AC | PRN
  Administered 2015-03-04: 100 mL via INTRAVENOUS

## 2015-03-04 MED ORDER — KETOROLAC TROMETHAMINE 15 MG/ML IJ SOLN
15.0000 mg | Freq: Once | INTRAMUSCULAR | Status: AC
  Administered 2015-03-04: 15 mg via INTRAVENOUS
  Filled 2015-03-04: qty 1

## 2015-03-04 NOTE — ED Notes (Signed)
C/o lower abd/pelvic cramping since Thursday.  States she was seen in ED on Friday and diagnosed with PID and R ovarian cyst.  States pain is worse since last seen.

## 2015-03-04 NOTE — Discharge Instructions (Signed)

## 2015-03-04 NOTE — ED Provider Notes (Signed)
CSN: [id number]     Arrival date & time 03/04/15  1532 History   First MD Initiated Contact with Patient 03/04/15 1637     Chief Complaint  Patient presents with  . Pelvic Pain     (Consider location/radiation/quality/duration/timing/severity/associated sxs/prior Treatment) HPI   19 year old female with lower abdominal/pelvic pain. Gradual onset about 3 weeks ago. Constant progressive. Patient was seen in the emergency room 2 days ago and diagnosed with pelvic inflammatory disease and a right ovarian cyst. No ultrasound evidence of torsion. She received Rocephin/azithromycin in the emergency room and was discharged on doxycycline for PID and cephalexin for possible UTI. She reports compliance with her medications. Symptoms have not sniffily improved. They may have worsened somewhat. No fevers or chills. Mild nausea. No vomiting. Some urinary urgency, otherwise no urinary complaints. No unusual vaginal bleeding or discharge.    Past Medical History  Diagnosis Date  . Anxiety   . PID (acute pelvic inflammatory disease)   . Ovarian cyst    History reviewed. No pertinent past surgical history. No family history on file. Social History  Substance Use Topics  . Smoking status: Current Every Day Smoker    Types: Cigarettes  . Smokeless tobacco: None  . Alcohol Use: No   OB History    No data available     Review of Systems  All systems reviewed and negative, other than as noted in HPI.   Allergies  Review of patient's allergies indicates no known allergies.  Home Medications   Prior to Admission medications   Medication Sig Start Date End Date Taking? Authorizing Provider  cephALEXin (KEFLEX) 500 MG capsule 2 caps po bid x 7 days 03/02/15   Mercedes Camprubi-Soms, PA-C  citalopram (CELEXA) 20 MG tablet Take 20 mg by mouth daily.    Historical Provider, MD  doxycycline (VIBRAMYCIN) 100 MG capsule Take 1 capsule (100 mg total) by mouth 2 (two) times daily. One po bid x 14 days  03/02/15   Mercedes Camprubi-Soms, PA-C  etonogestrel (NEXPLANON) 68 MG IMPL implant 1 each by Subdermal route continuous.     Historical Provider, MD  HYDROcodone-acetaminophen (NORCO) 5-325 MG tablet Take 1 tablet by mouth every 6 (six) hours as needed for severe pain. 03/02/15   Mercedes Camprubi-Soms, PA-C  ibuprofen (ADVIL,MOTRIN) 200 MG tablet Take 400 mg by mouth every 6 (six) hours as needed for moderate pain.    Historical Provider, MD  naproxen (NAPROSYN) 500 MG tablet Take 1 tablet (500 mg total) by mouth 2 (two) times daily as needed for mild pain, moderate pain or headache (TAKE WITH MEALS.). 03/02/15   Mercedes Camprubi-Soms, PA-C  ondansetron (ZOFRAN) 8 MG tablet Take 1 tablet (8 mg total) by mouth every 8 (eight) hours as needed for nausea or vomiting. 03/02/15   Mercedes Camprubi-Soms, PA-C   BP 128/45 mmHg  Pulse 72  Temp(Src) 97.3 F (36.3 C) (Oral)  Resp 16  Ht 5\' 4"  (1.626 m)  Wt 216 lb (97.977 kg)  BMI 37.06 kg/m2  SpO2 97%  LMP  (Approximate) Physical Exam  Constitutional: She appears well-developed and well-nourished. No distress.  HENT:  Head: Normocephalic and atraumatic.  Eyes: Conjunctivae are normal. Right eye exhibits no discharge. Left eye exhibits no discharge.  Neck: Neck supple.  Cardiovascular: Normal rate, regular rhythm and normal heart sounds.  Exam reveals no gallop and no friction rub.   No murmur heard. Pulmonary/Chest: Effort normal and breath sounds normal. No respiratory distress.  Abdominal: Soft. She exhibits no distension.  There is tenderness.  Mild tenderness across lower abdomen/pelvis. Does not particularly lateralize.   Musculoskeletal: She exhibits no edema or tenderness.  Neurological: She is alert.  Skin: Skin is warm and dry.  Psychiatric: Her behavior is normal. Thought content normal.  Flat affect. Tearful at times.  Nursing note and vitals reviewed.   ED Course  Procedures (including critical care time) Labs Review Labs  Reviewed  URINALYSIS, ROUTINE W REFLEX MICROSCOPIC (NOT AT [facility]) - Abnormal; Notable for the following:    Color, Urine AMBER (*)    APPearance CLOUDY (*)    Specific Gravity, Urine 1.038 (*)    Bilirubin Urine SMALL (*)    Ketones, ur 15 (*)    All other components within normal limits  URINE CULTURE  CBC WITH DIFFERENTIAL/PLATELET  COMPREHENSIVE METABOLIC PANEL  PREGNANCY, URINE    Imaging Review Ct Abdomen Pelvis W Contrast  03/04/2015  CLINICAL DATA:  Lower abdominal pain since Thursday. Worsening pain today. EXAM: CT ABDOMEN AND PELVIS WITH CONTRAST TECHNIQUE: Multidetector CT imaging of the abdomen and pelvis was performed using the standard protocol following bolus administration of intravenous contrast. CONTRAST:  OMNIPAQUE IOHEXOL 300 MG/ML  SOLN COMPARISON:  Pelvic ultrasound 03/02/2015 FINDINGS: Lower chest: The lung bases are clear of acute process. No pleural effusion or pulmonary lesions. The heart is normal in size. No pericardial effusion. The distal esophagus and aorta are unremarkable. Minimal patchy areas of subpleural dependent atelectasis. Hepatobiliary: Patchy D areas of low attenuation in segment 8 and segment 5 of the liver. There are vessels running through this area and I think this is most likely geographic fatty infiltration. No discrete hepatic mass or intrahepatic biliary dilatation. The gallbladder is filled with gallstones. No common bile duct dilatation. Pancreas: No mass, inflammation or ductal dilatation. Spleen: Normal size.  No focal lesions. Adrenals/Urinary Tract: The adrenal glands and kidneys are normal. No findings for pyelonephritis, renal or obstructing ureteral calculi. Stomach/Bowel: The stomach, duodenum, small bowel and colon are unremarkable. No inflammatory changes, mass lesions or obstructive findings. The terminal ileum is normal. The appendix is normal. Vascular/Lymphatic: No mesenteric or retroperitoneal mass or adenopathy. Small scattered  lymph nodes are noted. The aorta and branch vessels are normal. The major venous structures are normal. Reproductive: The uterus and ovaries are unremarkable. Other: The bladder is normal. No pelvic mass, adenopathy or free pelvic fluid collections. No inguinal mass or adenopathy. Musculoskeletal: No significant bony findings. IMPRESSION: 1. Probable geographic fatty infiltration involving the right hepatic lobe. MRI abdomen without and with contrast is suggested for further evaluation and confirmation (non urgent). 2. Cholelithiasis without definite CT findings for acute cholecystitis. 3. No acute abdominal/pelvic findings, mass lesions or adenopathy. Electronically Signed   By: Rudie Meyer M.D.   On: 03/04/2015 18:33   I have personally reviewed and evaluated these images and lab results as part of my medical decision-making.   EKG Interpretation None      MDM   Final diagnoses:  Pelvic pain in female  Calculus of gallbladder without cholecystitis without obstruction    19yF with lower abdominal/pelvic pain. Recent diagnosis of PID/possible UTI. No improvement of symptoms after two day of appropriate treatment. Pt very upset/tearful. May be some anxiety component. Doesn't seem colicky and no evidence of torsion on [place] two days ago. She is afebrile and nonotoxic appearing. She mentions her GYN once mentioned she may have IBS with similar pain previously. She also questions possible endometriosis. No suggestion lesions noted on [place]. Will  CT today to evaluate for possible appendicitis or other pathology potentially missed on [place], although my clinical suspicion for acute surgical process or other emergent issue is pretty low at this time. Continued symptomatic tx. Likely DC w/ reassuring work-up today. If so, continue prior treatment and needs GYN follow-up.   Imaging as above. Incidental findings. Does not correlate location of her symptoms. She is nontender right upper quadrant. Patient was informed  of these findings. MRI could be pursued as an outpatient. May benefit from surgical consultation if she develops symptoms consistent with biliary colic.    Raeford Razor, MD 03/04/15 [phone]

## 2015-03-04 NOTE — ED Notes (Signed)
Pt from home for eval of increase right sided pelvic pain, denies any vaginal discharge or odor at this time. States was seen Friday and dx with PID and right ovarian cyst. Denies any n/v/d or fevers. nad noted. [facility]

## 2015-03-05 LAB — URINE CULTURE: Culture: 1000

## 2015-03-05 LAB — GC/CHLAMYDIA PROBE AMP (~~LOC~~) NOT AT ARMC
CHLAMYDIA, DNA PROBE: NEGATIVE
Neisseria Gonorrhea: NEGATIVE

## 2015-03-07 ENCOUNTER — Emergency Department (HOSPITAL_COMMUNITY)
Admission: EM | Admit: 2015-03-07 | Discharge: 2015-03-08 | Disposition: A | Discharge status: Home / Self Care | Payer: Self-pay | Attending: Emergency Medicine | Admitting: Emergency Medicine

## 2015-03-07 ENCOUNTER — Emergency Department (HOSPITAL_COMMUNITY): Payer: Self-pay

## 2015-03-07 ENCOUNTER — Encounter (HOSPITAL_COMMUNITY): Payer: Self-pay | Admitting: Emergency Medicine

## 2015-03-07 DIAGNOSIS — Z3202 Encounter for pregnancy test, result negative: Secondary | ICD-10-CM | POA: Insufficient documentation

## 2015-03-07 DIAGNOSIS — M549 Dorsalgia, unspecified: Secondary | ICD-10-CM | POA: Insufficient documentation

## 2015-03-07 DIAGNOSIS — K5901 Slow transit constipation: Secondary | ICD-10-CM | POA: Insufficient documentation

## 2015-03-07 DIAGNOSIS — Z8659 Personal history of other mental and behavioral disorders: Secondary | ICD-10-CM | POA: Insufficient documentation

## 2015-03-07 DIAGNOSIS — Z720 Tobacco use: Secondary | ICD-10-CM | POA: Insufficient documentation

## 2015-03-07 DIAGNOSIS — Z8742 Personal history of other diseases of the female genital tract: Secondary | ICD-10-CM | POA: Insufficient documentation

## 2015-03-07 DIAGNOSIS — R102 Pelvic and perineal pain: Secondary | ICD-10-CM | POA: Insufficient documentation

## 2015-03-07 LAB — URINALYSIS, ROUTINE W REFLEX MICROSCOPIC
Bilirubin Urine: NEGATIVE
GLUCOSE, UA: NEGATIVE mg/dL
HGB URINE DIPSTICK: NEGATIVE
Ketones, ur: NEGATIVE mg/dL
Nitrite: NEGATIVE
PH: 6 (ref 5.0–8.0)
PROTEIN: NEGATIVE mg/dL
Specific Gravity, Urine: 1.028 (ref 1.005–1.030)
Urobilinogen, UA: 0.2 mg/dL (ref 0.0–1.0)

## 2015-03-07 LAB — I-STAT CHEM 8, ED
BUN: 5 mg/dL — AB (ref 6–20)
CHLORIDE: 102 mmol/L (ref 101–111)
CREATININE: 0.7 mg/dL (ref 0.44–1.00)
Calcium, Ion: 1.17 mmol/L (ref 1.12–1.23)
Glucose, Bld: 96 mg/dL (ref 65–99)
HEMATOCRIT: 48 % — AB (ref 36.0–46.0)
Hemoglobin: 16.3 g/dL — ABNORMAL HIGH (ref 12.0–15.0)
POTASSIUM: 3.7 mmol/L (ref 3.5–5.1)
Sodium: 142 mmol/L (ref 135–145)
TCO2: 26 mmol/L (ref 0–100)

## 2015-03-07 LAB — CBC WITH DIFFERENTIAL/PLATELET
BASOS PCT: 0 %
Basophils Absolute: 0 10*3/uL (ref 0.0–0.1)
Eosinophils Absolute: 0.4 10*3/uL (ref 0.0–0.7)
Eosinophils Relative: 4 %
HEMATOCRIT: 44.7 % (ref 36.0–46.0)
Hemoglobin: 14.8 g/dL (ref 12.0–15.0)
LYMPHS PCT: 53 %
Lymphs Abs: 5 10*3/uL — ABNORMAL HIGH (ref 0.7–4.0)
MCH: 29.5 pg (ref 26.0–34.0)
MCHC: 33.1 g/dL (ref 30.0–36.0)
MCV: 89.2 fL (ref 78.0–100.0)
MONO ABS: 0.6 10*3/uL (ref 0.1–1.0)
Monocytes Relative: 7 %
NEUTROS ABS: 3.4 10*3/uL (ref 1.7–7.7)
NEUTROS PCT: 36 %
Platelets: 308 10*3/uL (ref 150–400)
RBC: 5.01 MIL/uL (ref 3.87–5.11)
RDW: 13.8 % (ref 11.5–15.5)
WBC: 9.4 10*3/uL (ref 4.0–10.5)

## 2015-03-07 LAB — URINE MICROSCOPIC-ADD ON

## 2015-03-07 LAB — POC URINE PREG, ED: Preg Test, Ur: NEGATIVE

## 2015-03-07 MED ORDER — OXYCODONE-ACETAMINOPHEN 5-325 MG PO TABS
1.0000 | ORAL_TABLET | Freq: Once | ORAL | Status: AC
  Administered 2015-03-07: 1 via ORAL
  Filled 2015-03-07: qty 1

## 2015-03-07 MED ORDER — NAPROXEN 500 MG PO TABS
500.0000 mg | ORAL_TABLET | Freq: Two times a day (BID) | ORAL | Status: DC | PRN
Start: 2015-03-07 — End: 2015-12-19

## 2015-03-07 MED ORDER — OXYCODONE-ACETAMINOPHEN 5-325 MG PO TABS: 1.0000 | ORAL_TABLET | Freq: Four times a day (QID) | ORAL | Status: DC | PRN

## 2015-03-07 MED ORDER — POLYETHYLENE GLYCOL 3350 17 G PO PACK: 17.0000 g | PACK | Freq: Every day | ORAL | Status: DC

## 2015-03-07 NOTE — ED Notes (Signed)
Pt states that she has been evaluated for her pelvic pain and has been dx with an ovarian cyst and endometriosis but has had the pain x 1 week w/o relief. States the pain is 'like a toothache.' Alert and oriented.

## 2015-03-07 NOTE — Discharge Instructions (Signed)
Your labs were unremarkable today. Your xray shows you have a lot of stool in your abdomen, start taking miralax as directed. Use fiber and increase water in your diet to help. This is likely related to the narcotics you're taking for your abdominal pain. Stop taking keflex since your urine doesn't appear to be infected. Take naprosyn and percocet as directed as needed for pain but don't drive while taking percocet. Follow up with your OBGYN at your already scheduled appointment. Return to the ER for changes or worsening symptoms.  Abdominal (belly) pain can be caused by many things. Your caregiver performed an examination and possibly ordered blood/urine tests and imaging (CT scan, x-rays, ultrasound). Many cases can be observed and treated at home after initial evaluation in the emergency department. Even though you are being discharged home, abdominal pain can be unpredictable. Therefore, you need a repeated exam if your pain does not resolve, returns, or worsens. Most patients with abdominal pain don't have to be admitted to the hospital or have surgery, but serious problems like appendicitis and gallbladder attacks can start out as nonspecific pain. Many abdominal conditions cannot be diagnosed in one visit, so follow-up evaluations are very important. SEEK IMMEDIATE MEDICAL ATTENTION IF YOU DEVELOP ANY OF THE FOLLOWING SYMPTOMS:  The pain does not go away or becomes severe.   A temperature above 101 develops.   Repeated vomiting occurs (multiple episodes).   The pain becomes localized to portions of the abdomen. The right side could possibly be appendicitis. In an adult, the left lower portion of the abdomen could be colitis or diverticulitis.   Blood is being passed in stools or vomit (bright red or black tarry stools).   Return also if you develop chest pain, difficulty breathing, dizziness or fainting, or become confused, poorly responsive, or inconsolable (young children).  The  constipation stays for more than 4 days.   There is belly (abdominal) or rectal pain.   You do not seem to be getting better.     Abdominal Pain, Adult Many things can cause belly (abdominal) pain. Most times, the belly pain is not dangerous. Many cases of belly pain can be watched and treated at home. HOME CARE   Do not take medicines that help you go poop (laxatives) unless told to by your doctor.  Only take medicine as told by your doctor.  Eat or drink as told by your doctor. Your doctor will tell you if you should be on a special diet. GET HELP IF:  You do not know what is causing your belly pain.  You have belly pain while you are sick to your stomach (nauseous) or have runny poop (diarrhea).  You have pain while you pee or poop.  Your belly pain wakes you up at night.  You have belly pain that gets worse or better when you eat.  You have belly pain that gets worse when you eat fatty foods.  You have a fever. GET HELP RIGHT AWAY IF:   The pain does not go away within 2 hours.  You keep throwing up (vomiting).  The pain changes and is only in the right or left part of the belly.  You have bloody or tarry looking poop. MAKE SURE YOU:   Understand these instructions.  Will watch your condition.  Will get help right away if you are not doing well or get worse.   This information is not intended to replace advice given to you by your health care provider.  Make sure you discuss any questions you have with your health care provider.   Document Released: 10/01/2007 Document Revised: 05/05/2014 Document Reviewed: 12/22/2012 Elsevier Interactive Patient Education 2016 [employer].  Constipation, Adult Constipation is when a person has fewer than three bowel movements a week, has difficulty having a bowel movement, or has stools that are dry, hard, or larger than normal. As people grow older, constipation is more common. A low-fiber diet, not taking in enough  fluids, and taking certain medicines may make constipation worse.  CAUSES   Certain medicines, such as antidepressants, pain medicine, iron supplements, antacids, and water pills.   Certain diseases, such as diabetes, irritable bowel syndrome (IBS), thyroid disease, or depression.   Not drinking enough water.   Not eating enough fiber-rich foods.   Stress or travel.   Lack of physical activity or exercise.   Ignoring the urge to have a bowel movement.   Using laxatives too much.  SIGNS AND SYMPTOMS   Having fewer than three bowel movements a week.   Straining to have a bowel movement.   Having stools that are hard, dry, or larger than normal.   Feeling full or bloated.   Pain in the lower abdomen.   Not feeling relief after having a bowel movement.  DIAGNOSIS  Your health care provider will take a medical history and perform a physical exam. Further testing may be done for severe constipation. Some tests may include:  A barium enema X-ray to examine your rectum, colon, and, sometimes, your small intestine.   A sigmoidoscopy to examine your lower colon.   A colonoscopy to examine your entire colon. TREATMENT  Treatment will depend on the severity of your constipation and what is causing it. Some dietary treatments include drinking more fluids and eating more fiber-rich foods. Lifestyle treatments may include regular exercise. If these diet and lifestyle recommendations do not help, your health care provider may recommend taking over-the-counter laxative medicines to help you have bowel movements. Prescription medicines may be prescribed if over-the-counter medicines do not work.  HOME CARE INSTRUCTIONS   Eat foods that have a lot of fiber, such as fruits, vegetables, whole grains, and beans.  Limit foods high in fat and processed sugars, such as french fries, hamburgers, cookies, candies, and soda.   A fiber supplement may be added to your diet if you  cannot get enough fiber from foods.   Drink enough fluids to keep your urine clear or pale yellow.   Exercise regularly or as directed by your health care provider.   Go to the restroom when you have the urge to go. Do not hold it.   Only take over-the-counter or prescription medicines as directed by your health care provider. Do not take other medicines for constipation without talking to your health care provider first.  SEEK IMMEDIATE MEDICAL CARE IF:   You have bright red blood in your stool.   Your constipation lasts for more than 4 days or gets worse.   You have abdominal or rectal pain.   You have thin, pencil-like stools.   You have unexplained weight loss. MAKE SURE YOU:   Understand these instructions.  Will watch your condition.  Will get help right away if you are not doing well or get worse.   This information is not intended to replace advice given to you by your health care provider. Make sure you discuss any questions you have with your health care provider.   Document Released: 01/11/2004 Document  Revised: 05/05/2014 Document Reviewed: 01/24/2013 Elsevier Interactive Patient Education 2016 [employer] Inc[employer]  High-Fiber Diet Fiber, also called dietary fiber, is a type of carbohydrate found in fruits, vegetables, whole grains, and beans. A high-fiber diet can have many health benefits. Your health care provider may recommend a high-fiber diet to help:  Prevent constipation. Fiber can make your bowel movements more regular.  Lower your cholesterol.  Relieve hemorrhoids, uncomplicated diverticulosis, or irritable bowel syndrome.  Prevent overeating as part of a weight-loss plan.  Prevent heart disease, type 2 diabetes, and certain cancers. WHAT IS MY PLAN? The recommended daily intake of fiber includes:  38 grams for men under age 64.  30 grams for men over age 40.  25 grams for women under age 21.  21 grams for women over age 27. You can get  the recommended daily intake of dietary fiber by eating a variety of fruits, vegetables, grains, and beans. Your health care provider may also recommend a fiber supplement if it is not possible to get enough fiber through your diet. WHAT DO I NEED TO KNOW ABOUT A HIGH-FIBER DIET?  Fiber supplements have not been widely studied for their effectiveness, so it is better to get fiber through food sources.  Always check the fiber content on thenutrition facts label of any prepackaged food. Look for foods that contain at least 5 grams of fiber per serving.  Ask your dietitian if you have questions about specific foods that are related to your condition, especially if those foods are not listed in the following section.  Increase your daily fiber consumption gradually. Increasing your intake of dietary fiber too quickly may cause bloating, cramping, or gas.  Drink plenty of water. Water helps you to digest fiber. WHAT FOODS CAN I EAT? Grains Whole-grain breads. Multigrain cereal. Oats and oatmeal. Brown rice. Barley. Bulgur wheat. Millet. Bran muffins. Popcorn. Rye wafer crackers. Vegetables Sweet potatoes. Spinach. Kale. Artichokes. Cabbage. Broccoli. Green peas. Carrots. Squash. Fruits Berries. Pears. Apples. Oranges. Avocados. Prunes and raisins. Dried figs. Meats and Other Protein Sources Navy, kidney, pinto, and soy beans. Split peas. Lentils. Nuts and seeds. Dairy Fiber-fortified yogurt. Beverages Fiber-fortified soy milk. Fiber-fortified orange juice. Other Fiber bars. The items listed above may not be a complete list of recommended foods or beverages. Contact your dietitian for more options. WHAT FOODS ARE NOT RECOMMENDED? Grains White bread. Pasta made with refined flour. White rice. Vegetables Fried potatoes. Canned vegetables. Well-cooked vegetables.  Fruits Fruit juice. Cooked, strained fruit. Meats and Other Protein Sources Fatty cuts of meat. Fried Environmental education officer or fried  fish. Dairy Milk. Yogurt. Cream cheese. Sour cream. Beverages Soft drinks. Other Cakes and pastries. Butter and oils. The items listed above may not be a complete list of foods and beverages to avoid. Contact your dietitian for more information. WHAT ARE SOME TIPS FOR INCLUDING HIGH-FIBER FOODS IN MY DIET?  Eat a wide variety of high-fiber foods.  Make sure that half of all grains consumed each day are whole grains.  Replace breads and cereals made from refined flour or white flour with whole-grain breads and cereals.  Replace white rice with brown rice, bulgur wheat, or millet.  Start the day with a breakfast that is high in fiber, such as a cereal that contains at least 5 grams of fiber per serving.  Use beans in place of meat in soups, salads, or pasta.  Eat high-fiber snacks, such as berries, raw vegetables, nuts, or popcorn.   This information is not intended to  replace advice given to you by your health care provider. Make sure you discuss any questions you have with your health care provider.   Document Released: 04/14/2005 Document Revised: 05/05/2014 Document Reviewed: 09/27/2013 Elsevier Interactive Patient Education [employer]

## 2015-03-07 NOTE — ED Provider Notes (Signed)
CSN: [id number]     Arrival date & time 03/07/15  1748 History   First MD Initiated Contact with Patient 03/07/15 2039     Chief Complaint  Patient presents with  . Pelvic Pain     (Consider location/radiation/quality/duration/timing/severity/associated sxs/prior Treatment) HPI Comments: Tiffany Pennington is a 19 y.o. female with a PMHx of anxiety and L ovarian cyst, and recent diagnosis of R ovarian cyst, PID, possible UTI, and cholelithiasis, who presents to the ED with complaints of ongoing pelvic pain 3 weeks. She was seen in the ER twice in the last 5 days, has had negative gonorrhea and chlamydia testing, negative HIV and syphilis testing, initially presumed UTI which cultured negative, pelvic ultrasound which showed right ovarian cyst but no torsion, as well as a abdominal CT which showed incidental findings but no findings to explain her symptoms. At the last ER visit 3 days ago she was told she may have endometriosis. She states she has been compliant with doxycycline and Keflex given for presumed UTI and PID but these have not changed her symptoms. She is reports the pain is 10/10 constant aching in the lower abdomen, radiating to her lower back, worse with movement and eating, improved with heat, and unrelieved with home Norco and Naprosyn. She also reports she has had some constipation for the last 2 days with no bowel movement in 2 days. She is sexually active with one female partner, unprotected. LMP was 2 years ago due to the fact that she has an Implanon.  Denies fevers, chills, CP, SOB, N/V/D, melena, hematochezia, obstipation, hematuria, dysuria, vaginal bleeding/discharge, myalgias, arthralgias, numbness, tingling, weakness, or recent travel/sick contacts, suspicious food intake, EtOH use, or NSAID use. Has an OBGYN appt for next week.  Patient is a 19 y.o. female presenting with pelvic pain. The history is provided by the patient and medical records. No language interpreter was used.    Pelvic Pain This is a recurrent problem. The current episode started 1 to 4 weeks ago. The problem occurs constantly. The problem has been unchanged. Associated symptoms include abdominal pain. Pertinent negatives include no arthralgias, chest pain, chills, fever, myalgias, nausea, numbness, urinary symptoms, vomiting or weakness. The symptoms are aggravated by eating (movement and eating). She has tried heat, NSAIDs and oral narcotics for the symptoms. The treatment provided mild relief.    Past Medical History  Diagnosis Date  . Anxiety   . PID (acute pelvic inflammatory disease)   . Ovarian cyst    History reviewed. No pertinent past surgical history. History reviewed. No pertinent family history. Social History  Substance Use Topics  . Smoking status: Current Every Day Smoker    Types: Cigarettes  . Smokeless tobacco: None  . Alcohol Use: No   OB History    No data available     Review of Systems  Constitutional: Negative for fever and chills.  Respiratory: Negative for shortness of breath.   Cardiovascular: Negative for chest pain.  Gastrointestinal: Positive for abdominal pain and constipation. Negative for nausea, vomiting, diarrhea and blood in stool.  Genitourinary: Positive for pelvic pain. Negative for dysuria, hematuria, vaginal bleeding and vaginal discharge.  Musculoskeletal: Positive for back pain (radiating from abdomen). Negative for myalgias and arthralgias.  Skin: Negative for color change.  Allergic/Immunologic: Negative for immunocompromised state.  Neurological: Negative for weakness and numbness.  Psychiatric/Behavioral: Negative for confusion.   10 Systems reviewed and are negative for acute change except as noted in the HPI.    Allergies  Review  of patient's allergies indicates no known allergies.  Home Medications   Prior to Admission medications   Medication Sig Start Date End Date Taking? Authorizing Provider  cephALEXin (KEFLEX) 500 MG  capsule 2 caps po bid x 7 days 03/02/15   Che Below Camprubi-Soms, PA-C  citalopram (CELEXA) 20 MG tablet Take 20 mg by mouth every morning.     Historical Provider, MD  doxycycline (VIBRAMYCIN) 100 MG capsule Take 1 capsule (100 mg total) by mouth 2 (two) times daily. One po bid x 14 days 03/02/15   Jaamal Farooqui Camprubi-Soms, PA-C  etonogestrel (NEXPLANON) 68 MG IMPL implant 1 each by Subdermal route continuous.     Historical Provider, MD  HYDROcodone-acetaminophen (NORCO) 5-325 MG tablet Take 1 tablet by mouth every 6 (six) hours as needed for severe pain. 03/02/15   Rayquan Amrhein Camprubi-Soms, PA-C  ibuprofen (ADVIL,MOTRIN) 200 MG tablet Take 400 mg by mouth every 6 (six) hours as needed for moderate pain.    Historical Provider, MD  naproxen (NAPROSYN) 500 MG tablet Take 1 tablet (500 mg total) by mouth 2 (two) times daily as needed for mild pain, moderate pain or headache (TAKE WITH MEALS.). 03/02/15   Josuel Koeppen Camprubi-Soms, PA-C  ondansetron (ZOFRAN) 8 MG tablet Take 1 tablet (8 mg total) by mouth every 8 (eight) hours as needed for nausea or vomiting. 03/02/15   Mahagony Grieb Camprubi-Soms, PA-C   BP 118/68 mmHg  Pulse 78  Temp(Src) 98.4 F (36.9 C) (Oral)  Resp 16  SpO2 100%  LMP  (Approximate) Physical Exam  Constitutional: She is oriented to person, place, and time. Vital signs are normal. She appears well-developed and well-nourished.  Non-toxic appearance. No distress.  Afebrile, nontoxic, NAD  HENT:  Head: Normocephalic and atraumatic.  Mouth/Throat: Oropharynx is clear and moist and mucous membranes are normal.  Eyes: Conjunctivae and EOM are normal. Right eye exhibits no discharge. Left eye exhibits no discharge.  Neck: Normal range of motion. Neck supple.  Cardiovascular: Normal rate, regular rhythm, normal heart sounds and intact distal pulses.  Exam reveals no gallop and no friction rub.   No murmur heard. Pulmonary/Chest: Effort normal and breath sounds normal. No respiratory distress.  She has no decreased breath sounds. She has no wheezes. She has no rhonchi. She has no rales.  Abdominal: Soft. Normal appearance and bowel sounds are normal. She exhibits no distension. There is tenderness in the right lower quadrant, suprapubic area and left lower quadrant. There is no rigidity, no rebound, no guarding, no CVA tenderness, no tenderness at McBurney's point and negative Murphy's sign.    Soft, obese but nondistended, +BS throughout, with diffuse lower abd TTP near pelvic brim, no r/g/r, neg murphy's, neg mcburney's, no CVA TTP   Genitourinary: No vaginal discharge found.  Musculoskeletal: Normal range of motion.  Neurological: She is alert and oriented to person, place, and time. She has normal strength. No sensory deficit.  Skin: Skin is warm, dry and intact. No rash noted.  Psychiatric: She has a normal mood and affect.  Nursing note and vitals reviewed.   ED Course  Procedures (including critical care time) Labs Review Labs Reviewed  URINALYSIS, ROUTINE W REFLEX MICROSCOPIC (NOT AT [facility]) - Abnormal; Notable for the following:    APPearance CLOUDY (*)    Leukocytes, UA TRACE (*)    All other components within normal limits  URINE MICROSCOPIC-ADD ON - Abnormal; Notable for the following:    Squamous Epithelial / LPF MANY (*)    Bacteria, UA MANY (*)  All other components within normal limits  CBC WITH DIFFERENTIAL/PLATELET - Abnormal; Notable for the following:    Lymphs Abs 5.0 (*)    All other components within normal limits  I-STAT CHEM 8, ED - Abnormal; Notable for the following:    BUN 5 (*)    Hemoglobin 16.3 (*)    HCT 48.0 (*)    All other components within normal limits  CBC WITH DIFFERENTIAL/PLATELET  POC URINE PREG, ED    Results for SHEANNA, DAIL (MRN [id number]) as of 03/07/2015 21:02  Ref. Range 03/02/2015 00:00 03/02/2015 16:05  Chlamydia Unknown Negative   Neisseria gonorrhea Unknown Negative   RPR Latest Ref Range: Non Reactive   Non Reactive    HIV Latest Ref Range: Non Reactive   Non Reactive   Results for orders placed or performed during the hospital encounter of 03/04/15  Urine culture  Result Value Ref Range   Specimen Description URINE, CLEAN CATCH    Special Requests NONE    Culture 1,000 COLONIES/mL INSIGNIFICANT GROWTH    Report Status 03/05/2015 FINAL   CBC with Differential  Result Value Ref Range   WBC 8.0 4.0 - 10.5 K/uL   RBC 4.97 3.87 - 5.11 MIL/uL   Hemoglobin 14.7 12.0 - 15.0 g/dL   HCT 40.9 81.1 - 91.4 %   MCV 86.9 78.0 - 100.0 fL   MCH 29.6 26.0 - 34.0 pg   MCHC 34.0 30.0 - 36.0 g/dL   RDW 78.2 95.6 - 21.3 %   Platelets 284 150 - 400 K/uL   Neutrophils Relative % 48 %   Neutro Abs 3.9 1.7 - 7.7 K/uL   Lymphocytes Relative 37 %   Lymphs Abs 3.0 0.7 - 4.0 K/uL   Monocytes Relative 11 %   Monocytes Absolute 0.8 0.1 - 1.0 K/uL   Eosinophils Relative 3 %   Eosinophils Absolute 0.3 0.0 - 0.7 K/uL   Basophils Relative 1 %   Basophils Absolute 0.0 0.0 - 0.1 K/uL  Comprehensive metabolic panel  Result Value Ref Range   Sodium 140 135 - 145 mmol/L   Potassium 3.6 3.5 - 5.1 mmol/L   Chloride 105 101 - 111 mmol/L   CO2 23 22 - 32 mmol/L   Glucose, Bld 87 65 - 99 mg/dL   BUN 7 6 - 20 mg/dL   Creatinine, Ser 0.86 0.44 - 1.00 mg/dL   Calcium 9.8 8.9 - 57.8 mg/dL   Total Protein 7.1 6.5 - 8.1 g/dL   Albumin 4.0 3.5 - 5.0 g/dL   AST 24 15 - 41 U/L   ALT 26 14 - 54 U/L   Alkaline Phosphatase 65 38 - 126 U/L   Total Bilirubin 0.4 0.3 - 1.2 mg/dL   GFR calc non Af Amer >60 >60 mL/min   GFR calc Af Amer >60 >60 mL/min   Anion gap 12 5 - 15  Urinalysis, Routine w reflex microscopic (not at [facility])  Result Value Ref Range   Color, Urine AMBER (A) YELLOW   APPearance CLOUDY (A) CLEAR   Specific Gravity, Urine 1.038 (H) 1.005 - 1.030   pH 6.0 5.0 - 8.0   Glucose, UA NEGATIVE NEGATIVE mg/dL   Hgb urine dipstick NEGATIVE NEGATIVE   Bilirubin Urine SMALL (A) NEGATIVE   Ketones, ur 15 (A) NEGATIVE mg/dL    Protein, ur NEGATIVE NEGATIVE mg/dL   Urobilinogen, UA 1.0 0.0 - 1.0 mg/dL   Nitrite NEGATIVE NEGATIVE   Leukocytes, UA NEGATIVE NEGATIVE  Pregnancy, urine  Result Value Ref Range   Preg Test, Ur NEGATIVE NEGATIVE     Imaging Review Dg Abd Acute W/chest  03/07/2015  CLINICAL DATA:  Lower abdominal and pelvic pain for past week EXAM: DG ABDOMEN ACUTE W/ 1V CHEST COMPARISON:  CT abdomen and pelvis March 04, 2015 FINDINGS: PA chest: Lungs are clear. Heart size and pulmonary vascularity are normal. No adenopathy. Supine and upright abdomen: There is contrast in the colon. There is fairly diffuse stool throughout the colon. Colon is not distended with stool. There is no bowel dilatation or air-fluid level suggesting obstruction. No free air. No abnormal calcifications. IMPRESSION: Stool throughout the colon diffusely without colonic dilatation from stool. No obstruction or free air. Lungs clear. There is residual contrast in the colon. Electronically Signed   By: Bretta Bang III M.D.   On: 03/07/2015 21:57    US Transvaginal Non-ob  03/02/2015  CLINICAL DATA:  Left adnexal tenderness. EXAM: TRANSABDOMINAL AND TRANSVAGINAL ULTRASOUND OF PELVIS DOPPLER ULTRASOUND OF OVARIES TECHNIQUE: Both transabdominal and transvaginal ultrasound examinations of the pelvis were performed. Transabdominal technique was performed for global imaging of the pelvis including uterus, ovaries, adnexal regions, and pelvic cul-de-sac. It was necessary to proceed with endovaginal exam following the transabdominal exam to visualize the uterus, endometrium and ovaries. Color and duplex Doppler ultrasound was utilized to evaluate blood flow to the ovaries. COMPARISON:  None. FINDINGS: Uterus Measurements: 6.9 x 3.1 x 4.0 cm. No fibroids or other mass visualized. Endometrium Thickness: 4.0 mm.  No focal abnormality visualized. Right ovary Measurements: 5.3 x 3.1 x 2.5 cm. Cyst measures 3.9 x 2.6 x 2.2 cm. No septation or mural  nodularity. No adnexal mass. Left ovary Measurements: 3.6 x 1.7 x 2.7 cm. Normal appearance/no adnexal mass. Pulsed Doppler evaluation of both ovaries demonstrates normal low-resistance arterial and venous waveforms. Other findings Moderate free fluid identified within the pelvis. IMPRESSION: 1. No evidence for ovarian torsion. 2. Right ovary cyst which measures 3.9 cm. This is almost certainly benign, and no specific imaging follow up is recommended according to the Society of Radiologists in Ultrasound2010 Consensus Conference Statement (D Lenis Noon et al. Management of Asymptomatic Ovarian and Other Adnexal Cysts Imaged at [place]: Society of Radiologists in Ultrasound Consensus Conference Statement 2010. Radiology 256 (Sept 2010): 943-954.). 3. Moderate free fluid identified within the pelvis. Electronically Signed   By: Signa Kell M.D.   On: 03/02/2015 18:27   US Pelvis Complete  03/02/2015  CLINICAL DATA:  Left adnexal tenderness. EXAM: TRANSABDOMINAL AND TRANSVAGINAL ULTRASOUND OF PELVIS DOPPLER ULTRASOUND OF OVARIES TECHNIQUE: Both transabdominal and transvaginal ultrasound examinations of the pelvis were performed. Transabdominal technique was performed for global imaging of the pelvis including uterus, ovaries, adnexal regions, and pelvic cul-de-sac. It was necessary to proceed with endovaginal exam following the transabdominal exam to visualize the uterus, endometrium and ovaries. Color and duplex Doppler ultrasound was utilized to evaluate blood flow to the ovaries. COMPARISON:  None. FINDINGS: Uterus Measurements: 6.9 x 3.1 x 4.0 cm. No fibroids or other mass visualized. Endometrium Thickness: 4.0 mm.  No focal abnormality visualized. Right ovary Measurements: 5.3 x 3.1 x 2.5 cm. Cyst measures 3.9 x 2.6 x 2.2 cm. No septation or mural nodularity. No adnexal mass. Left ovary Measurements: 3.6 x 1.7 x 2.7 cm. Normal appearance/no adnexal mass. Pulsed Doppler evaluation of both ovaries demonstrates normal  low-resistance arterial and venous waveforms. Other findings Moderate free fluid identified within the pelvis. IMPRESSION: 1. No evidence for ovarian torsion. 2. Right ovary cyst  which measures 3.9 cm. This is almost certainly benign, and no specific imaging follow up is recommended according to the Society of Radiologists in Ultrasound2010 Consensus Conference Statement (D Lenis Noon et al. Management of Asymptomatic Ovarian and Other Adnexal Cysts Imaged at [place]: Society of Radiologists in Ultrasound Consensus Conference Statement 2010. Radiology 256 (Sept 2010): 943-954.). 3. Moderate free fluid identified within the pelvis. Electronically Signed   By: Signa Kell M.D.   On: 03/02/2015 18:27   Ct Abdomen Pelvis W Contrast  03/04/2015  CLINICAL DATA:  Lower abdominal pain since Thursday. Worsening pain today. EXAM: CT ABDOMEN AND PELVIS WITH CONTRAST TECHNIQUE: Multidetector CT imaging of the abdomen and pelvis was performed using the standard protocol following bolus administration of intravenous contrast. CONTRAST:  OMNIPAQUE IOHEXOL 300 MG/ML  SOLN COMPARISON:  Pelvic ultrasound 03/02/2015 FINDINGS: Lower chest: The lung bases are clear of acute process. No pleural effusion or pulmonary lesions. The heart is normal in size. No pericardial effusion. The distal esophagus and aorta are unremarkable. Minimal patchy areas of subpleural dependent atelectasis. Hepatobiliary: Patchy D areas of low attenuation in segment 8 and segment 5 of the liver. There are vessels running through this area and I think this is most likely geographic fatty infiltration. No discrete hepatic mass or intrahepatic biliary dilatation. The gallbladder is filled with gallstones. No common bile duct dilatation. Pancreas: No mass, inflammation or ductal dilatation. Spleen: Normal size.  No focal lesions. Adrenals/Urinary Tract: The adrenal glands and kidneys are normal. No findings for pyelonephritis, renal or obstructing ureteral  calculi. Stomach/Bowel: The stomach, duodenum, small bowel and colon are unremarkable. No inflammatory changes, mass lesions or obstructive findings. The terminal ileum is normal. The appendix is normal. Vascular/Lymphatic: No mesenteric or retroperitoneal mass or adenopathy. Small scattered lymph nodes are noted. The aorta and branch vessels are normal. The major venous structures are normal. Reproductive: The uterus and ovaries are unremarkable. Other: The bladder is normal. No pelvic mass, adenopathy or free pelvic fluid collections. No inguinal mass or adenopathy. Musculoskeletal: No significant bony findings. IMPRESSION: 1. Probable geographic fatty infiltration involving the right hepatic lobe. MRI abdomen without and with contrast is suggested for further evaluation and confirmation (non urgent). 2. Cholelithiasis without definite CT findings for acute cholecystitis. 3. No acute abdominal/pelvic findings, mass lesions or adenopathy. Electronically Signed   By: Rudie Meyer M.D.   On: 03/04/2015 18:33   [place] Art/ven Flow Abd Pelv Doppler  03/02/2015  CLINICAL DATA:  Left adnexal tenderness. EXAM: TRANSABDOMINAL AND TRANSVAGINAL ULTRASOUND OF PELVIS DOPPLER ULTRASOUND OF OVARIES TECHNIQUE: Both transabdominal and transvaginal ultrasound examinations of the pelvis were performed. Transabdominal technique was performed for global imaging of the pelvis including uterus, ovaries, adnexal regions, and pelvic cul-de-sac. It was necessary to proceed with endovaginal exam following the transabdominal exam to visualize the uterus, endometrium and ovaries. Color and duplex Doppler ultrasound was utilized to evaluate blood flow to the ovaries. COMPARISON:  None. FINDINGS: Uterus Measurements: 6.9 x 3.1 x 4.0 cm. No fibroids or other mass visualized. Endometrium Thickness: 4.0 mm.  No focal abnormality visualized. Right ovary Measurements: 5.3 x 3.1 x 2.5 cm. Cyst measures 3.9 x 2.6 x 2.2 cm. No septation or mural  nodularity. No adnexal mass. Left ovary Measurements: 3.6 x 1.7 x 2.7 cm. Normal appearance/no adnexal mass. Pulsed Doppler evaluation of both ovaries demonstrates normal low-resistance arterial and venous waveforms. Other findings Moderate free fluid identified within the pelvis. IMPRESSION: 1. No evidence for ovarian torsion. 2. Right ovary cyst  which measures 3.9 cm. This is almost certainly benign, and no specific imaging follow up is recommended according to the Society of Radiologists in Ultrasound2010 Consensus Conference Statement (D Lenis Noon et al. Management of Asymptomatic Ovarian and Other Adnexal Cysts Imaged at [place]: Society of Radiologists in Ultrasound Consensus Conference Statement 2010. Radiology 256 (Sept 2010): 943-954.). 3. Moderate free fluid identified within the pelvis. Electronically Signed   By: Signa Kell M.D.   On: 03/02/2015 18:27    I have personally reviewed and evaluated these images and lab results as part of my medical decision-making.   EKG Interpretation None      MDM   Final diagnoses:  Pelvic pain in female  Slow transit constipation    19 y.o. female here with ongoing pelvic pain x3 weeks, seen in ER twice in the last 5 days, had neg GC/CT testing (originially empirically treated for PID given her +CMT and diffuse pelvic tenderness), treated for ?UTI but culture with insignificant growth, had pelvic U/S with R ovarian cyst, labs always unremarkable, Upreg always neg, then came back and had CT that showed incidental findings without findings to explain symptoms, diagnosed with possible endometriosis. States her pain continues despite norco/naprosyn. Zofran helping with nausea, no nausea complaint here. Has OBGYN appt next week. Comes here today for ongoing pain. Tenderness diffusely in lower abdomen. States she hasn't had a BM in 2 days, will get basic labs, urine, and xray of abdomen to ensure no obstruction/perf. Doubt need for pelvic since this was just done.  Will give percocet to see if this helps more over the norco she's been taking, despite the fact that this will likely contribute to worse constipation. Will reassess shortly.   11:52 PM Upreg neg. U/A again contaminated, but given last UCx was neg, doubt need for ongoing UTI treatment. Chem 8 WNL. CBC w/diff unremarkable. Abd xray showing stool throughout colon. Pain greatly improved. Discussed that narcotics will make her more constipated, but since this seemed to finally help with her pain agreed to give her percocet for home. Discussed fiber/water intake, and miralax to combat constipation. F/up with OBGYN at her already scheduled appt. D/c keflex since doubt UTI, but continue doxycycline for ?PID. I explained the diagnosis and have given explicit precautions to return to the ER including for any other new or worsening symptoms. The patient understands and accepts the medical plan as it's been dictated and I have answered their questions. Discharge instructions concerning home care and prescriptions have been given. The patient is STABLE and is discharged to home in good condition.  BP 117/66 mmHg  Pulse 65  Temp(Src) 98 F (36.7 C) (Oral)  Resp 18  SpO2 99%  LMP  (Approximate)  Meds ordered this encounter  Medications  . oxyCODONE-acetaminophen (PERCOCET/ROXICET) 5-325 MG per tablet 1 tablet    Sig:   . polyethylene glycol (MIRALAX / GLYCOLAX) packet    Sig: Take 17 g by mouth daily.    Dispense:  14 each    Refill:  0    Order Specific Question:  Supervising Provider    Answer:  Hyacinth Meeker, BRIAN [[id number]]  . oxyCODONE-acetaminophen (PERCOCET) 5-325 MG tablet    Sig: Take 1 tablet by mouth every 6 (six) hours as needed for severe pain.    Dispense:  6 tablet    Refill:  0    Order Specific Question:  Supervising Provider    Answer:  MILLER, BRIAN [[id number]]  . naproxen (NAPROSYN) 500 MG tablet  Sig: Take 1 tablet (500 mg total) by mouth 2 (two) times daily as needed for mild pain, moderate  pain or headache (TAKE WITH MEALS.).    Dispense:  20 tablet    Refill:  0    Order Specific Question:  Supervising Provider    Answer:  Eber Hong [[id number]]     Keya Wynes Camprubi-Soms, PA-C 03/08/15 0002  Alvira Monday, MD 03/08/15 1719

## 2015-03-16 ENCOUNTER — Ambulatory Visit (INDEPENDENT_AMBULATORY_CARE_PROVIDER_SITE_OTHER): Payer: Self-pay | Admitting: Advanced Practice Midwife

## 2015-03-16 ENCOUNTER — Encounter: Payer: Self-pay | Admitting: Obstetrics & Gynecology

## 2015-03-16 VITALS — BP 124/61 | HR 82 | Temp 98.9°F | Ht 63.0 in | Wt 213.8 lb

## 2015-03-16 DIAGNOSIS — K5909 Other constipation: Secondary | ICD-10-CM | POA: Insufficient documentation

## 2015-03-16 DIAGNOSIS — R109 Unspecified abdominal pain: Secondary | ICD-10-CM

## 2015-03-16 DIAGNOSIS — K589 Irritable bowel syndrome without diarrhea: Secondary | ICD-10-CM

## 2015-03-16 DIAGNOSIS — N83299 Other ovarian cyst, unspecified side: Secondary | ICD-10-CM

## 2015-03-16 NOTE — Patient Instructions (Signed)
Irritable Bowel Syndrome, Adult Irritable bowel syndrome (IBS) is not one specific disease. It is a group of symptoms that affects the organs responsible for digestion (gastrointestinal or GI tract).  To regulate how your GI tract works, your body sends signals back and forth between your intestines and your brain. If you have IBS, there may be a problem with these signals. As a result, your GI tract does not function normally. Your intestines may become more sensitive and overreact to certain things. This is especially true when you eat certain foods or when you are under stress.  There are four types of IBS. These may be determined based on the consistency of your stool:   IBS with diarrhea.   IBS with constipation.   Mixed IBS.   Unsubtyped IBS.  It is important to know which type of IBS you have. Some treatments are more likely to be helpful for certain types of IBS.  CAUSES  The exact cause of IBS is not known. RISK FACTORS You may have a higher risk of IBS if:  You are a woman.  You are younger than 20 years old.  You have a family history of IBS.  You have mental health problems.  You have had bacterial infection of your GI tract. SIGNS AND SYMPTOMS  Symptoms of IBS vary from person to person. The main symptom is abdominal pain or discomfort. Additional symptoms usually include one or more of the following:   Diarrhea, constipation, or both.   Abdominal swelling or bloating.   Feeling full or sick after eating a small or regular-size meal.   Frequent gas.   Mucus in the stool.   A feeling of having more stool left after a bowel movement.  Symptoms tend to come and go. They may be associated with stress, psychiatric conditions, or nothing at all.  DIAGNOSIS  There is no specific test to diagnose IBS. Your health care provider will make a diagnosis based on a physical exam, medical history, and your symptoms. You may have other tests to rule out other  conditions that may be causing your symptoms. These may include:   Blood tests.   X-rays.   CT scan.  Endoscopy and colonoscopy. This is a test in which your GI tract is viewed with a long, thin, flexible tube. TREATMENT There is no cure for IBS, but treatment can help relieve symptoms. IBS treatment often includes:   Changes to your diet, such as:  Eating more fiber.  Avoiding foods that cause symptoms.  Drinking more water.  Eating regular, medium-sized portioned meals.  Medicines. These may include:  Fiber supplements if you have constipation.  Medicine to control diarrhea (antidiarrheal medicines).  Medicine to help control muscle spasms in your GI tract (antispasmodic medicines).  Medicines to help with any mental health issues, such as antidepressants or tranquilizers.  Therapy.  Talk therapy may help with anxiety, depression, or other mental health issues that can make IBS symptoms worse.  Stress reduction.  Managing your stress can help keep symptoms under control. HOME CARE INSTRUCTIONS   Take medicines only as directed by your health care provider.  Eat a healthy diet.  Avoid foods and drinks with added sugar.  Include more whole grains, fruits, and vegetables gradually into your diet. This may be especially helpful if you have IBS with constipation.  Avoid any foods and drinks that make your symptoms worse. These may include dairy products and caffeinated or carbonated drinks.  Do not eat large meals.    Drink enough fluid to keep your urine clear or pale yellow.  Exercise regularly. Ask your health care provider for recommendations of good activities for you.  Keep all follow-up visits as directed by your health care provider. This is important. SEEK MEDICAL CARE IF:   You have constant pain.  You have trouble or pain with swallowing.  You have worsening diarrhea. SEEK IMMEDIATE MEDICAL CARE IF:   You have severe and worsening abdominal  pain.   You have diarrhea and:   You have a rash, stiff neck, or severe headache.   You are irritable, sleepy, or difficult to awaken.   You are weak, dizzy, or extremely thirsty.   You have bright red blood in your stool or you have black tarry stools.   You have unusual abdominal swelling that is painful.   You vomit continuously.   You vomit blood (hematemesis).   You have both abdominal pain and a fever.    This information is not intended to replace advice given to you by your health care provider. Make sure you discuss any questions you have with your health care provider.   Document Released: 04/14/2005 Document Revised: 05/05/2014 Document Reviewed: 12/30/2013 Elsevier Interactive Patient Education 2016 [employer]  

## 2015-03-16 NOTE — Progress Notes (Signed)
Chief Complaint:  Follow-up   None     HPI: Tiffany Pennington is a 19 y.o. No obstetric history on file. who presents to [facility] reporting persistent abdominal pain.  She was seen in the ED for new lower abdominal pain.  She had CT scanning and Ultrasound done. These were remarkable only for a 3.9cm right ovarian cyst.. She reports that the pain was never in the right, rather on the left.  States has a BM every day but xray showed stool throughout colon.  Has Nexplanon which was placed for painful periods.  States does not have any bleeding since getting it.  Was told she might have endometriosis.  Has never been diagnosed surgically for this.  She reports no vaginal bleeding, vaginal itching/burning, urinary symptoms, h/a, dizziness, n/v, or fever/chills.    HPI  Past Medical History: Past Medical History  Diagnosis Date  . Anxiety   . PID (acute pelvic inflammatory disease)   . Ovarian cyst     Past obstetric history: OB History  No data available    Past Surgical History: No past surgical history on file.  Family History: No family history on file.  Social History: Social History  Substance Use Topics  . Smoking status: Current Every Day Smoker    Types: Cigarettes  . Smokeless tobacco: None  . Alcohol Use: No    Allergies: No Known Allergies  Meds:  (Not in a hospital admission)  ROS:  Review of Systems  Constitutional: Negative for fever, chills and appetite change.  Gastrointestinal: Positive for abdominal pain and constipation. Negative for nausea, vomiting, diarrhea and abdominal distention.  Genitourinary: Negative for dysuria, vaginal discharge and vaginal pain.   I have reviewed patient's Past Medical Hx, Surgical Hx, Family Hx, Social Hx, medications and allergies.   Physical Exam   Constitutional: Well-developed, well-nourished female in no acute distress.  Cardiovascular: normal rate and rhythm Respiratory: normal effort, no distress. Lungs CTAB  with no wheezes or crackles GI: Abd soft, non-tender.  Nondistended.  No rebound, No guarding.  MS: Extremities nontender, no edema, normal ROM Neurologic: Alert and oriented x 4.   Grossly nonfocal. GU: Neg CVAT. Skin:  Warm and Dry Psych:  Affect appropriate.    Labs: No results found for this or any previous visit (from the past 24 hour(s)).    Imaging:  US Transvaginal Non-ob  03/02/2015  CLINICAL DATA:  Left adnexal tenderness. EXAM: TRANSABDOMINAL AND TRANSVAGINAL ULTRASOUND OF PELVIS DOPPLER ULTRASOUND OF OVARIES TECHNIQUE: Both transabdominal and transvaginal ultrasound examinations of the pelvis were performed. Transabdominal technique was performed for global imaging of the pelvis including uterus, ovaries, adnexal regions, and pelvic cul-de-sac. It was necessary to proceed with endovaginal exam following the transabdominal exam to visualize the uterus, endometrium and ovaries. Color and duplex Doppler ultrasound was utilized to evaluate blood flow to the ovaries. COMPARISON:  None. FINDINGS: Uterus Measurements: 6.9 x 3.1 x 4.0 cm. No fibroids or other mass visualized. Endometrium Thickness: 4.0 mm.  No focal abnormality visualized. Right ovary Measurements: 5.3 x 3.1 x 2.5 cm. Cyst measures 3.9 x 2.6 x 2.2 cm. No septation or mural nodularity. No adnexal mass. Left ovary Measurements: 3.6 x 1.7 x 2.7 cm. Normal appearance/no adnexal mass. Pulsed Doppler evaluation of both ovaries demonstrates normal low-resistance arterial and venous waveforms. Other findings Moderate free fluid identified within the pelvis. IMPRESSION: 1. No evidence for ovarian torsion. 2. Right ovary cyst which measures 3.9 cm. This is almost certainly benign, and no specific  imaging follow up is recommended according to the Society of Radiologists in Ultrasound2010 Consensus Conference Statement (D Lavonda Jumbo al. Management of Asymptomatic Ovarian and Other Adnexal Cysts Imaged at [place]: Society of Radiologists in  Ultrasound Consensus Conference Statement 2010. Radiology 256 (Sept 2010): 943-954.). 3. Moderate free fluid identified within the pelvis. Electronically Signed   By: Signa Kell M.D.   On: 03/02/2015 18:27   US Pelvis Complete  03/02/2015  CLINICAL DATA:  Left adnexal tenderness. EXAM: TRANSABDOMINAL AND TRANSVAGINAL ULTRASOUND OF PELVIS DOPPLER ULTRASOUND OF OVARIES TECHNIQUE: Both transabdominal and transvaginal ultrasound examinations of the pelvis were performed. Transabdominal technique was performed for global imaging of the pelvis including uterus, ovaries, adnexal regions, and pelvic cul-de-sac. It was necessary to proceed with endovaginal exam following the transabdominal exam to visualize the uterus, endometrium and ovaries. Color and duplex Doppler ultrasound was utilized to evaluate blood flow to the ovaries. COMPARISON:  None. FINDINGS: Uterus Measurements: 6.9 x 3.1 x 4.0 cm. No fibroids or other mass visualized. Endometrium Thickness: 4.0 mm.  No focal abnormality visualized. Right ovary Measurements: 5.3 x 3.1 x 2.5 cm. Cyst measures 3.9 x 2.6 x 2.2 cm. No septation or mural nodularity. No adnexal mass. Left ovary Measurements: 3.6 x 1.7 x 2.7 cm. Normal appearance/no adnexal mass. Pulsed Doppler evaluation of both ovaries demonstrates normal low-resistance arterial and venous waveforms. Other findings Moderate free fluid identified within the pelvis. IMPRESSION: 1. No evidence for ovarian torsion. 2. Right ovary cyst which measures 3.9 cm. This is almost certainly benign, and no specific imaging follow up is recommended according to the Society of Radiologists in Ultrasound2010 Consensus Conference Statement (D Lenis Noon et al. Management of Asymptomatic Ovarian and Other Adnexal Cysts Imaged at [place]: Society of Radiologists in Ultrasound Consensus Conference Statement 2010. Radiology 256 (Sept 2010): [id number].). 3. Moderate free fluid identified within the pelvis. Electronically Signed   By:  Signa Kell M.D.   On: 03/02/2015 18:27   Ct Abdomen Pelvis W Contrast  03/04/2015  CLINICAL DATA:  Lower abdominal pain since Thursday. Worsening pain today. EXAM: CT ABDOMEN AND PELVIS WITH CONTRAST TECHNIQUE: Multidetector CT imaging of the abdomen and pelvis was performed using the standard protocol following bolus administration of intravenous contrast. CONTRAST:  OMNIPAQUE IOHEXOL 300 MG/ML  SOLN COMPARISON:  Pelvic ultrasound 03/02/2015 FINDINGS: Lower chest: The lung bases are clear of acute process. No pleural effusion or pulmonary lesions. The heart is normal in size. No pericardial effusion. The distal esophagus and aorta are unremarkable. Minimal patchy areas of subpleural dependent atelectasis. Hepatobiliary: Patchy D areas of low attenuation in segment 8 and segment 5 of the liver. There are vessels running through this area and I think this is most likely geographic fatty infiltration. No discrete hepatic mass or intrahepatic biliary dilatation. The gallbladder is filled with gallstones. No common bile duct dilatation. Pancreas: No mass, inflammation or ductal dilatation. Spleen: Normal size.  No focal lesions. Adrenals/Urinary Tract: The adrenal glands and kidneys are normal. No findings for pyelonephritis, renal or obstructing ureteral calculi. Stomach/Bowel: The stomach, duodenum, small bowel and colon are unremarkable. No inflammatory changes, mass lesions or obstructive findings. The terminal ileum is normal. The appendix is normal. Vascular/Lymphatic: No mesenteric or retroperitoneal mass or adenopathy. Small scattered lymph nodes are noted. The aorta and branch vessels are normal. The major venous structures are normal. Reproductive: The uterus and ovaries are unremarkable. Other: The bladder is normal. No pelvic mass, adenopathy or free pelvic fluid collections. No inguinal mass or  adenopathy. Musculoskeletal: No significant bony findings. IMPRESSION: 1. 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 MRI abdomen without and with contrast is suggested for further evaluation and confirmation (non urgent). 2. Cholelithiasis without definite CT findings for acute cholecystitis. 3. No acute abdominal/pelvic findings, mass lesions or adenopathy. Electronically Signed   By: Rudie MeyerP.  Gallerani M.D.   On: 03/04/2015 18:33   [place] Art/ven Flow Abd Pelv Doppler  03/02/2015  CLINICAL DATA:  Left adnexal tenderness. EXAM: TRANSABDOMINAL AND TRANSVAGINAL ULTRASOUND OF PELVIS DOPPLER ULTRASOUND OF OVARIES TECHNIQUE: Both transabdominal and transvaginal ultrasound examinations of the pelvis were performed. Transabdominal technique was performed for global imaging of the pelvis including uterus, ovaries, adnexal regions, and pelvic cul-de-sac. It was necessary to proceed with endovaginal exam following the transabdominal exam to visualize the uterus, endometrium and ovaries. Color and duplex Doppler ultrasound was utilized to evaluate blood flow to the ovaries. COMPARISON:  None. FINDINGS: Uterus Measurements: 6.9 x 3.1 x 4.0 cm. No fibroids or other mass visualized. Endometrium Thickness: 4.0 mm.  No focal abnormality visualized. Right ovary Measurements: 5.3 x 3.1 x 2.5 cm. Cyst measures 3.9 x 2.6 x 2.2 cm. No septation or mural nodularity. No adnexal mass. Left ovary Measurements: 3.6 x 1.7 x 2.7 cm. Normal appearance/no adnexal mass. Pulsed Doppler evaluation of both ovaries demonstrates normal low-resistance arterial and venous waveforms. Other findings Moderate free fluid identified within the pelvis. IMPRESSION: 1. No evidence for ovarian torsion. 2. Right ovary cyst which measures 3.9 cm. This is almost certainly benign, and no specific imaging follow up is recommended according to the Society of Radiologists in Ultrasound2010 Consensus Conference Statement (D Lenis NoonLevine et al. Management of Asymptomatic Ovarian and Other Adnexal Cysts Imaged at [place]: Society of Radiologists in  Ultrasound Consensus Conference Statement 2010. Radiology 256 (Sept 2010): [phone].). 3. Moderate free fluid identified within the pelvis. Electronically Signed   By: Signa Kellaylor  Stroud M.D.   On: 03/02/2015 18:27   Dg Abd Acute W/chest  03/07/2015  CLINICAL DATA:  Lower abdominal and pelvic pain for past week EXAM: DG ABDOMEN ACUTE W/ 1V CHEST COMPARISON:  CT abdomen and pelvis March 04, 2015 FINDINGS: PA chest: Lungs are clear. Heart size and pulmonary vascularity are normal. No adenopathy. Supine and upright abdomen: There is contrast in the colon. There is fairly diffuse stool throughout the colon. Colon is not distended with stool. There is no bowel dilatation or air-fluid level suggesting obstruction. No free air. No abnormal calcifications. IMPRESSION: Stool throughout the colon diffusely without colonic dilatation from stool. No obstruction or free air. Lungs clear. There is residual contrast in the colon. Electronically Signed   By: Bretta BangWilliam  Woodruff III M.D.   On: 03/07/2015 21:57    MAU Course/MDM: Imaging Results reviewed.   Consult Dr Marice Potterove      Pt stable at time of discharge.  Assessment: 1. IBS (irritable bowel syndrome)   2. Abdominal pain of unknown etiology   3. Functional ovarian cysts   4. Chronic constipation     Plan: Discharge home Recommend Fiber therapy, Miralax, and probiotics    Medication List       This list is accurate as of: 03/16/15  9:51 AM.  Always use your most recent med list.               citalopram 20 MG tablet  Commonly known as:  CELEXA  Take 20 mg by mouth every morning.     doxycycline 100 MG capsule  Commonly known  as:  VIBRAMYCIN  Take 1 capsule (100 mg total) by mouth 2 (two) times daily. One po bid x 14 days     HYDROcodone-acetaminophen 5-325 MG tablet  Commonly known as:  NORCO  Take 1 tablet by mouth every 6 (six) hours as needed for severe pain.     ibuprofen 200 MG tablet  Commonly known as:  ADVIL,MOTRIN  Take 400 mg by  mouth every 6 (six) hours as needed for moderate pain.     naproxen 500 MG tablet  Commonly known as:  NAPROSYN  Take 1 tablet (500 mg total) by mouth 2 (two) times daily as needed for mild pain, moderate pain or headache (TAKE WITH MEALS.).     naproxen 500 MG tablet  Commonly known as:  NAPROSYN  Take 1 tablet (500 mg total) by mouth 2 (two) times daily as needed for mild pain, moderate pain or headache (TAKE WITH MEALS.).     NEXPLANON 68 MG Impl implant  Generic drug:  etonogestrel  1 each by Subdermal route continuous.     ondansetron 8 MG tablet  Commonly known as:  ZOFRAN  Take 1 tablet (8 mg total) by mouth every 8 (eight) hours as needed for nausea or vomiting.     oxyCODONE-acetaminophen 5-325 MG tablet  Commonly known as:  PERCOCET  Take 1 tablet by mouth every 6 (six) hours as needed for severe pain.     polyethylene glycol packet  Commonly known as:  MIRALAX / GLYCOLAX  Take 17 g by mouth daily.       Encouraged to return here or to other Urgent Care/ED if she develops worsening of symptoms, increase in pain, fever, or other concerning symptoms.   Wynelle Bourgeois CNM, MSN Certified Nurse-Midwife 03/16/2015 9:51 AM

## 2015-03-19 ENCOUNTER — Ambulatory Visit: Payer: Self-pay

## 2015-05-08 ENCOUNTER — Ambulatory Visit (HOSPITAL_COMMUNITY)
Admission: RE | Admit: 2015-05-08 | Discharge: 2015-05-08 | Disposition: A | Discharge status: Home / Self Care | Payer: Self-pay | Attending: Psychiatry | Admitting: Psychiatry
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
## 2015-05-14 ENCOUNTER — Emergency Department (HOSPITAL_COMMUNITY)
Admission: EM | Admit: 2015-05-14 | Discharge: 2015-05-14 | Disposition: A | Discharge status: Home / Self Care | Payer: PRIVATE HEALTH INSURANCE | Attending: Emergency Medicine | Admitting: Emergency Medicine

## 2015-05-14 ENCOUNTER — Emergency Department (HOSPITAL_COMMUNITY): Payer: PRIVATE HEALTH INSURANCE

## 2015-05-14 ENCOUNTER — Encounter (HOSPITAL_COMMUNITY): Payer: Self-pay | Admitting: Emergency Medicine

## 2015-05-14 DIAGNOSIS — S0502XA Injury of conjunctiva and corneal abrasion without foreign body, left eye, initial encounter: Secondary | ICD-10-CM | POA: Insufficient documentation

## 2015-05-14 DIAGNOSIS — Y9289 Other specified places as the place of occurrence of the external cause: Secondary | ICD-10-CM | POA: Insufficient documentation

## 2015-05-14 DIAGNOSIS — Y998 Other external cause status: Secondary | ICD-10-CM | POA: Insufficient documentation

## 2015-05-14 DIAGNOSIS — Z8742 Personal history of other diseases of the female genital tract: Secondary | ICD-10-CM | POA: Diagnosis not present

## 2015-05-14 DIAGNOSIS — S0512XA Contusion of eyeball and orbital tissues, left eye, initial encounter: Secondary | ICD-10-CM | POA: Insufficient documentation

## 2015-05-14 DIAGNOSIS — F419 Anxiety disorder, unspecified: Secondary | ICD-10-CM | POA: Insufficient documentation

## 2015-05-14 DIAGNOSIS — S3991XA Unspecified injury of abdomen, initial encounter: Secondary | ICD-10-CM | POA: Insufficient documentation

## 2015-05-14 DIAGNOSIS — T7491XA Unspecified adult maltreatment, confirmed, initial encounter: Secondary | ICD-10-CM

## 2015-05-14 DIAGNOSIS — S0093XA Contusion of unspecified part of head, initial encounter: Secondary | ICD-10-CM

## 2015-05-14 DIAGNOSIS — F17210 Nicotine dependence, cigarettes, uncomplicated: Secondary | ICD-10-CM | POA: Diagnosis not present

## 2015-05-14 DIAGNOSIS — S0990XA Unspecified injury of head, initial encounter: Secondary | ICD-10-CM | POA: Diagnosis present

## 2015-05-14 DIAGNOSIS — Z79899 Other long term (current) drug therapy: Secondary | ICD-10-CM | POA: Diagnosis not present

## 2015-05-14 DIAGNOSIS — Y9389 Activity, other specified: Secondary | ICD-10-CM | POA: Insufficient documentation

## 2015-05-14 MED ORDER — ACETAMINOPHEN 500 MG PO TABS: 500.0000 mg | ORAL_TABLET | Freq: Four times a day (QID) | ORAL | Status: AC | PRN

## 2015-05-14 MED ORDER — IBUPROFEN 800 MG PO TABS: 800.0000 mg | ORAL_TABLET | Freq: Three times a day (TID) | ORAL | Status: DC

## 2015-05-14 MED ORDER — KETOROLAC TROMETHAMINE 60 MG/2ML IM SOLN
60.0000 mg | Freq: Once | INTRAMUSCULAR | Status: AC
  Administered 2015-05-14: 60 mg via INTRAMUSCULAR
  Filled 2015-05-14: qty 2

## 2015-05-14 MED ORDER — HYDROCODONE-ACETAMINOPHEN 5-325 MG PO TABS
1.0000 | ORAL_TABLET | Freq: Once | ORAL | Status: AC
  Administered 2015-05-14: 1 via ORAL
  Filled 2015-05-14: qty 1
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
## 2015-05-14 NOTE — ED Notes (Signed)
Patient transported to X-ray 

## 2015-05-14 NOTE — Discharge Instructions (Signed)
Concussion, Adult   A concussion, or closed-head injury, is a brain injury caused by a direct blow to the head or by a quick and sudden movement (jolt) of the head or neck. Concussions are usually not life-threatening. Even so, the effects of a concussion can be serious. If you have had a concussion before, you are more likely to experience concussion-like symptoms after a direct blow to the head.  CAUSES  Direct blow to the head, such as from running into another player during a soccer game, being hit in a fight, or hitting your head on a hard surface.  A jolt of the head or neck that causes the brain to move back and forth inside the skull, such as in a car crash. SIGNS AND SYMPTOMS  The signs of a concussion can be hard to notice. Early on, they may be missed by you, family members, and health care providers. You may look fine but act or feel differently.  Symptoms are usually temporary, but they may last for days, weeks, or even longer. Some symptoms may appear right away while others may not show up for hours or days. Every head injury is different. Symptoms include:  Mild to moderate headaches that will not go away.  A feeling of pressure inside your head.  Having more trouble than usual:  Learning or remembering things you have heard.  Answering questions.  Paying attention or concentrating.  Organizing daily tasks.  Making decisions and solving problems. Slowness in thinking, acting or reacting, speaking, or reading.  Getting lost or being easily confused.  Feeling tired all the time or lacking energy (fatigued).  Feeling drowsy.  Sleep disturbances.  Sleeping more than usual.  Sleeping less than usual.  Trouble falling asleep.  Trouble sleeping (insomnia). Loss of balance or feeling lightheaded or dizzy.  Nausea or vomiting.  Numbness or tingling.  Increased sensitivity to:  Sounds.  Lights.  Distractions. Vision problems or eyes that tire easily.  Diminished sense of  taste or smell.  Ringing in the ears.  Mood changes such as feeling sad or anxious.  Becoming easily irritated or angry for little or no reason.  Lack of motivation.  Seeing or hearing things other people do not see or hear (hallucinations). DIAGNOSIS  Your health care provider can usually diagnose a concussion based on a description of your injury and symptoms. He or she will ask whether you passed out (lost consciousness) and whether you are having trouble remembering events that happened right before and during your injury.  Your evaluation might include:  A brain scan to look for signs of injury to the brain. Even if the test shows no injury, you may still have a concussion.  Blood tests to be sure other problems are not present. TREATMENT  Concussions are usually treated in an emergency department, in urgent care, or at a clinic. You may need to stay in the hospital overnight for further treatment.  Tell your health care provider if you are taking any medicines, including prescription medicines, over-the-counter medicines, and natural remedies. Some medicines, such as blood thinners (anticoagulants) and aspirin, may increase the chance of complications. Also tell your health care provider whether you have had alcohol or are taking illegal drugs. This information may affect treatment.  Your health care provider will send you home with important instructions to follow.  How fast you will recover from a concussion depends on many factors. These factors include how severe your concussion is, what part of your  brain was injured, your age, and how healthy you were before the concussion.  Most people with mild injuries recover fully. Recovery can take time. In general, recovery is slower in older persons. Also, persons who have had a concussion in the past or have other medical problems may find that it takes longer to recover from their current injury. HOME CARE INSTRUCTIONS  General Instructions    Carefully follow the directions your health care provider gave you.  Only take over-the-counter or prescription medicines for pain, discomfort, or fever as directed by your health care provider.  Take only those medicines that your health care provider has approved.  Do not drink alcohol until your health care provider says you are well enough to do so. Alcohol and certain other drugs may slow your recovery and can put you at risk of further injury.  If it is harder than usual to remember things, write them down.  If you are easily distracted, try to do one thing at a time. For example, do not try to watch TV while fixing dinner.  Talk with family members or close friends when making important decisions.  Keep all follow-up appointments. Repeated evaluation of your symptoms is recommended for your recovery.  Watch your symptoms and tell others to do the same. Complications sometimes occur after a concussion. Older adults with a brain injury may have a higher risk of serious complications, such as a blood clot on the brain.  Tell your teachers, school nurse, school counselor, coach, athletic trainer, or work Production designer, theatre/television/film about your injury, symptoms, and restrictions. Tell them about what you can or cannot do. They should watch for:  Increased problems with attention or concentration.  Increased difficulty remembering or learning new information.  Increased time needed to complete tasks or assignments.  Increased irritability or decreased ability to cope with stress.  Increased symptoms. Rest. Rest helps the brain to heal. Make sure you:  Get plenty of sleep at night. Avoid staying up late at night.  Keep the same bedtime hours on weekends and weekdays.  Rest during the day. Take daytime naps or rest breaks when you feel tired. Limit activities that require a lot of thought or concentration. These include:  Doing homework or job-related work.  Watching TV.  Working on the computer. Avoid any  situation where there is potential for another head injury (football, hockey, soccer, basketball, martial arts, downhill snow sports and horseback riding). Your condition will get worse every time you experience a concussion. You should avoid these activities until you are evaluated by the appropriate follow-up health care providers. Returning To Your Regular Activities  You will need to return to your normal activities slowly, not all at once. You must give your body and brain enough time for recovery.  Do not return to sports or other athletic activities until your health care provider tells you it is safe to do so.  Ask your health care provider when you can drive, ride a bicycle, or operate heavy machinery. Your ability to react may be slower after a brain injury. Never do these activities if you are dizzy.  Ask your health care provider about when you can return to work or school. Preventing Another Concussion  It is very important to avoid another brain injury, especially before you have recovered. In rare cases, another injury can lead to permanent brain damage, brain swelling, or death. The risk of this is greatest during the first 7-10 days after a head injury. Avoid injuries  by:  Wearing a seat belt when riding in a car.  Drinking alcohol only in moderation.  Wearing a helmet when biking, skiing, skateboarding, skating, or doing similar activities.  Avoiding activities that could lead to a second concussion, such as contact or recreational sports, until your health care provider says it is okay.  Taking safety measures in your home.  Remove clutter and tripping hazards from floors and stairways.  Use grab bars in bathrooms and handrails by stairs.  Place non-slip mats on floors and in bathtubs.  Improve lighting in dim areas. SEEK MEDICAL CARE IF:  You have increased problems paying attention or concentrating.  You have increased difficulty remembering or learning new information.  You  need more time to complete tasks or assignments than before.  You have increased irritability or decreased ability to cope with stress.  You have more symptoms than before. Seek medical care if you have any of the following symptoms for more than 2 weeks after your injury:  Lasting (chronic) headaches.  Dizziness or balance problems.  Nausea.  Vision problems.  Increased sensitivity to noise or light.  Depression or mood swings.  Anxiety or irritability.  Memory problems.  Difficulty concentrating or paying attention.  Sleep problems.  Feeling tired all the time. SEEK IMMEDIATE MEDICAL CARE IF:  You have severe or worsening headaches. These may be a sign of a blood clot in the brain.  You have weakness (even if only in one hand, leg, or part of the face).  You have numbness.  You have decreased coordination.  You vomit repeatedly.  You have increased sleepiness.  One pupil is larger than the other.  You have convulsions.  You have slurred speech.  You have increased confusion. This may be a sign of a blood clot in the brain.  You have increased restlessness, agitation, or irritability.  You are unable to recognize people or places.  You have neck pain.  It is difficult to wake you up.  You have unusual behavior changes.  You lose consciousness. MAKE SURE YOU:  Understand these instructions.  Will watch your condition.  Will get help right away if you are not doing well or get worse. This information is not intended to replace advice given to you by your health care provider. Make sure you discuss any questions you have with your health care provider.  Document Released: 07/05/2003 Document Revised: 05/05/2014 Document Reviewed: 11/04/2012  Elsevier Interactive Patient Education 2016 [employer].   Domestic Violence Information WHAT IS DOMESTIC VIOLENCE?  Domestic violence, also called intimate partner violence, can involve physical, emotional, psychological, sexual, and  economic abuse by a current or former intimate partner. Stalking is also considered a type of domestic violence. Domestic violence can happen between people who are or were:   Married.  Dating.  Living together. Abusers repeatedly act to maintain control and power over their partner. Physical abuse can include:  Slapping.   Hitting.   Kicking.   Punching.  Choking.  Pulling the victim's hair.  Damaging the victim's property.  Threatening or hurting the victim with weapons.  Trapping the victim in his or her home.  Forcing the victim to use drugs or alcohol. Emotional and psychological abuse can include:  Threats.  Insults.   Isolation.   Humiliation.  Jealousy and possessiveness.  Blame.  Withholding affection.   Intimidation.   Manipulation.   Limiting contact with friends and family.  Sexual abuse can include:  Forcing sex.   Forcing  sexual touching.   Hurting the victim during sex.  Forcing the victim to have sex with other people.  Giving the victim a sexually transmitted disease (STD) on purpose. Economic abuse can include:  Controlling resources, such as money, food, transportation, a phone, or computer.  Stealing money from the victim or his or her family or friends.  Forbidding the victim to work.  Refusing to work or to contribute to the household. Stalking can include:  Making repeated, unwanted phone calls, emails, or text messages.  Leaving cards, letters, flowers or other items the victim does not want.  Watching or following the victim from a distance.  Going to places where the victim does not want the abuser.  Entering the victim's home or car.  Damaging the victim's personal property. WHAT ARE SOME WARNING SIGNS OF DOMESTIC VIOLENCE?  Physical signs  Bruises.   Broken bones.   Burns or cuts.   Physical pain.   Head injury. Emotional and psychological signs  Crying.   Depression.    Hopelessness.   Desperation.   Trouble sleeping.   Fear of partner.   Anxiety.  Suicidal behavior.  Antisocial behavior.  Low self-esteem.  Fear of intimacy.  Flashbacks. Sexual signs  Bruising, swelling, or bleeding of the genital or rectal area.   Signs of a sexually transmitted infection, such as genital sores, warts, or discharge coming from the genital area.   Pain in the genital area.   Unintended pregnancy.  Problems with pregnancy, including delayed prenatal care and prematurity. Economic signs  Having little money or food.   Homelessness.  Asking for or borrowing money. WHAT ARE COMMON BEHAVIORS OF THOSE AFFECTED BY DOMESTIC VIOLENCE? Those affected by domestic violence may:   Be late to work or other events.   Not show up to places as promised.   Have to let their partner know where they are and who they are with.   Be isolated or kept from seeing friends or family.   Make comments about their partner's temper or behavior.   Make excuses for their partner.   Engage in high-risk sexual behaviors.  Use drugs or alcohol.  Have unhealthy diet-related behaviors. WHAT ARE COMMON FEELINGS OF THOSE AFFECTED BY DOMESTIC VIOLENCE?  Those affected by domestic violence may feel that:   They have to be careful not to say or do things that trigger their partner's anger.   They cannot do anything right.   They deserve to be treated badly.   They are overreacting to their partner's behavior or temper.   They cannot trust their own feelings.   They cannot trust other people.   They are trapped.   Their partner would take away their children.   They are emotionally drained or numb.   Their life is in danger.   They might have to kill their partner to survive.  WHERE CAN YOU GET HELP?  Domestic violence hotlines and websites If you do not feel safe searching for help online at home, use a computer at [employer] to access [employer]. Call 911 if you are in immediate danger or need medical help.   [employer].  The 24-hour phone hotline is 7697912611 or [phone] (TTY).   The videophone is available Monday through Friday, 9 a.m. to 5 p.m. Call [phone].  The website is http://thehotline.org  The National Sexual Assault Hotline.  The 24-hour phone hotline is (724) 041-4003.  You can access the online hotline at [employer]  Shelters for victims of domestic violence  If you are a victim of domestic violence, there are resources to help you find a temporary place for you and your children to live (shelter). The specific address of these shelters is often not known to the public.  Police Report assaults, threats, and stalking to the police.  Counselors and counseling centers People who have been victims of domestic violence can benefit from counseling. Counseling can help you cope with difficult emotions and empower you to plan for your future safety. The topics you discuss with a counselor are private and confidential. Children of domestic violence victims also might need counseling to manage stress and anxiety.  The court system You can work with a Clinical research associatelawyer or an advocate to get legal protection against an abuser. Protection includes restraining orders and private addresses. Crimes against you, such as assault, can also be prosecuted through the courts. Laws vary by state.   This information is not intended to replace advice given to you by your health care provider. Make sure you discuss any questions you have with your health care provider.   Document Released: 07/05/2003 Document Revised: 05/05/2014 Document Reviewed: 12/30/2013 Elsevier Interactive Patient Education 2016 Elsevier Inc.  Facial or Scalp Contusion  A facial or scalp contusion is a deep bruise on the face or head. Contusions happen when an injury causes  bleeding under the skin. Signs of bruising include pain, puffiness (swelling), and discolored skin. The contusion may turn blue, purple, or yellow. HOME CARE  Only take medicines as told by your doctor.  Put ice on the injured area.  Put ice in a plastic bag.  Place a towel between your skin and the bag.  Leave the ice on for 20 minutes, 2-3 times a day. GET HELP IF:  You have bite problems.  You have pain when chewing.  You are worried about your face not healing normally. GET HELP RIGHT AWAY IF:   You have severe pain or a headache and medicine does not help.  You are very tired or confused, or your personality changes.  You throw up (vomit).  You have a nosebleed that will not stop.  You see two of everything (double vision) or have blurry vision.  You have fluid coming from your nose or ear.  You have problems walking or using your arms or legs. MAKE SURE YOU:   Understand these instructions.  Will watch your condition.  Will get help right away if you are not doing well or get worse.   This information is not intended to replace advice given to you by your health care provider. Make sure you discuss any questions you have with your health care provider.   Document Released: 04/03/2011 Document Revised: 05/05/2014 Document Reviewed: 11/25/2012 Elsevier Interactive Patient Education [employer]2016 [employer] Inc.
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
## 2015-05-14 NOTE — ED Notes (Signed)
SW at bedside.

## 2015-05-15 MED ORDER — LIDOCAINE HCL (CARDIAC) 20 MG/ML IV SOLN
INTRAVENOUS | Status: AC
  Filled 2015-05-15: qty 5

## 2015-05-15 MED ORDER — LIDOCAINE HCL (CARDIAC) 20 MG/ML IV SOLN
INTRAVENOUS | Status: AC
Start: 2015-05-15 — End: 2015-05-15
  Filled 2015-05-15: qty 5

## 2015-05-15 MED ORDER — PROPOFOL 500 MG/50ML IV EMUL
INTRAVENOUS | Status: AC
  Filled 2015-05-15: qty 50

## 2015-05-15 MED ORDER — DEXAMETHASONE SODIUM PHOSPHATE 10 MG/ML IJ SOLN
INTRAMUSCULAR | Status: AC
  Filled 2015-05-15: qty 1

## 2015-05-15 MED ORDER — KETOROLAC TROMETHAMINE 30 MG/ML IJ SOLN
INTRAMUSCULAR | Status: AC
  Filled 2015-05-15: qty 1

## 2015-05-15 MED ORDER — MIDAZOLAM HCL 2 MG/2ML IJ SOLN
INTRAMUSCULAR | Status: AC
  Filled 2015-05-15: qty 2

## 2015-05-15 MED ORDER — FUROSEMIDE 10 MG/ML IJ SOLN
INTRAMUSCULAR | Status: AC
  Filled 2015-05-15: qty 2

## 2015-05-15 MED ORDER — PROPOFOL 10 MG/ML IV BOLUS
INTRAVENOUS | Status: AC
  Filled 2015-05-15: qty 40

## 2015-05-15 MED ORDER — METOCLOPRAMIDE HCL 5 MG/ML IJ SOLN
INTRAMUSCULAR | Status: AC
  Filled 2015-05-15: qty 2

## 2015-05-15 MED ORDER — FENTANYL CITRATE (PF) 100 MCG/2ML IJ SOLN
INTRAMUSCULAR | Status: AC
  Filled 2015-05-15: qty 4

## 2015-05-15 MED ORDER — ONDANSETRON HCL 4 MG/2ML IJ SOLN
INTRAMUSCULAR | Status: AC
  Filled 2015-05-15: qty 2

## 2015-05-19 ENCOUNTER — Emergency Department (HOSPITAL_COMMUNITY)
Admission: EM | Admit: 2015-05-19 | Discharge: 2015-05-19 | Disposition: A | Discharge status: Home / Self Care | Payer: PRIVATE HEALTH INSURANCE | Attending: Emergency Medicine | Admitting: Emergency Medicine

## 2015-05-19 ENCOUNTER — Encounter (HOSPITAL_COMMUNITY): Payer: Self-pay | Admitting: Emergency Medicine

## 2015-05-19 DIAGNOSIS — F17200 Nicotine dependence, unspecified, uncomplicated: Secondary | ICD-10-CM | POA: Diagnosis not present

## 2015-05-19 DIAGNOSIS — H5711 Ocular pain, right eye: Secondary | ICD-10-CM | POA: Diagnosis present

## 2015-05-19 DIAGNOSIS — H209 Unspecified iridocyclitis: Secondary | ICD-10-CM

## 2015-05-19 DIAGNOSIS — F419 Anxiety disorder, unspecified: Secondary | ICD-10-CM | POA: Insufficient documentation

## 2015-05-19 DIAGNOSIS — Z79899 Other long term (current) drug therapy: Secondary | ICD-10-CM | POA: Insufficient documentation

## 2015-05-19 DIAGNOSIS — Z791 Long term (current) use of non-steroidal anti-inflammatories (NSAID): Secondary | ICD-10-CM | POA: Diagnosis not present

## 2015-05-19 DIAGNOSIS — Z8742 Personal history of other diseases of the female genital tract: Secondary | ICD-10-CM | POA: Diagnosis not present

## 2015-05-19 MED ORDER — FLUORESCEIN SODIUM 1 MG OP STRP
1.0000 | ORAL_STRIP | Freq: Once | OPHTHALMIC | Status: AC
  Administered 2015-05-19: 1 via OPHTHALMIC
  Filled 2015-05-19: qty 1

## 2015-05-19 MED ORDER — CYCLOPENTOLATE HCL 1 % OP SOLN: 1.0000 [drp] | Freq: Three times a day (TID) | OPHTHALMIC | Status: DC

## 2015-05-19 MED ORDER — IBUPROFEN 800 MG PO TABS
800.0000 mg | ORAL_TABLET | Freq: Once | ORAL | Status: AC
  Administered 2015-05-19: 800 mg via ORAL
  Filled 2015-05-19: qty 1

## 2015-05-19 MED ORDER — ACETAMINOPHEN 500 MG PO TABS
1000.0000 mg | ORAL_TABLET | Freq: Once | ORAL | Status: AC
  Administered 2015-05-19: 1000 mg via ORAL
  Filled 2015-05-19: qty 2

## 2015-05-19 MED ORDER — TETRACAINE HCL 0.5 % OP SOLN
2.0000 [drp] | Freq: Once | OPHTHALMIC | Status: AC
  Administered 2015-05-19: 2 [drp] via OPHTHALMIC
  Filled 2015-05-19: qty 4
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
## 2015-05-19 NOTE — Discharge Instructions (Signed)
Uveitis °Uveitis is the swelling and irritation in the eye. Most of the time, it affects the middle part of the eye (uvea). There are different types uveitis: °· Iritis. This type affects the colored part of the eye. °· Intermediate uveitis. This type affects the middle of the eye. °· Posterior uveitis. This type affects the back of the eye. °· Panuveitis. This type affects all layers of the eye. °HOME CARE °· Take medicines only as told by your doctor. These include over-the-counter medicines and prescription medicines. °· Follow instructions from your doctor about what activities are safe for you. °· Do not use any tobacco products. These include cigarettes, chewing tobacco, and e-cigarettes. If you need help quitting, ask your doctor. °· Keep all follow-up visits as told by your doctor. This is important. °GET HELP IF: °· Your medicines are not working. °GET HELP RIGHT AWAY IF: °· You have redness in one eye or both eyes. °· Light bothers your eyes a lot. °· You have pain in your eye. °· You have aching in your eye. °· You cannot see as much as you did before. °  °This information is not intended to replace advice given to you by your health care provider. Make sure you discuss any questions you have with your health care provider. °  °Document Released: 08/29/2014 Document Reviewed: 08/29/2014 °Elsevier Interactive Patient Education ©2016 [employer] ° °
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
## 2015-07-16 ENCOUNTER — Other Ambulatory Visit: Payer: Self-pay | Admitting: Family Medicine

## 2015-07-16 ENCOUNTER — Ambulatory Visit
Admission: RE | Admit: 2015-07-16 | Discharge: 2015-07-16 | Disposition: A | Discharge status: Home / Self Care | Payer: PRIVATE HEALTH INSURANCE | Source: Ambulatory Visit | Attending: Family Medicine | Admitting: Family Medicine

## 2015-07-16 DIAGNOSIS — M25562 Pain in left knee: Secondary | ICD-10-CM

## 2015-10-23 ENCOUNTER — Other Ambulatory Visit: Payer: Self-pay

## 2015-10-23 NOTE — Telephone Encounter (Signed)
Attempted to call patient but there was no answer or voicemail to leave a message. 

## 2015-10-23 NOTE — Telephone Encounter (Signed)
Left message on voicemail requesting med refill unsure of the name. Please advise

## 2015-10-25 NOTE — Telephone Encounter (Signed)
Pt seen in our office once in November 2016. No rx given, only advised to take fiber supplements.

## 2015-10-30 ENCOUNTER — Encounter (HOSPITAL_COMMUNITY): Payer: Self-pay | Admitting: Emergency Medicine

## 2015-10-30 ENCOUNTER — Emergency Department (HOSPITAL_COMMUNITY)
Admission: EM | Admit: 2015-10-30 | Discharge: 2015-10-30 | Disposition: A | Discharge status: Home / Self Care | Payer: 59 | Attending: Emergency Medicine | Admitting: Emergency Medicine

## 2015-10-30 DIAGNOSIS — Z79899 Other long term (current) drug therapy: Secondary | ICD-10-CM | POA: Insufficient documentation

## 2015-10-30 DIAGNOSIS — N390 Urinary tract infection, site not specified: Secondary | ICD-10-CM

## 2015-10-30 DIAGNOSIS — F17210 Nicotine dependence, cigarettes, uncomplicated: Secondary | ICD-10-CM | POA: Insufficient documentation

## 2015-10-30 DIAGNOSIS — N898 Other specified noninflammatory disorders of vagina: Secondary | ICD-10-CM

## 2015-10-30 LAB — URINE MICROSCOPIC-ADD ON

## 2015-10-30 LAB — URINALYSIS, ROUTINE W REFLEX MICROSCOPIC
BILIRUBIN URINE: NEGATIVE
Glucose, UA: NEGATIVE mg/dL
HGB URINE DIPSTICK: NEGATIVE
KETONES UR: 15 mg/dL — AB
NITRITE: NEGATIVE
PH: 6 (ref 5.0–8.0)
Protein, ur: NEGATIVE mg/dL
SPECIFIC GRAVITY, URINE: 1.029 (ref 1.005–1.030)

## 2015-10-30 LAB — PREGNANCY, URINE: Preg Test, Ur: NEGATIVE

## 2015-10-30 LAB — WET PREP, GENITAL
CLUE CELLS WET PREP: NONE SEEN
Sperm: NONE SEEN
TRICH WET PREP: NONE SEEN
Yeast Wet Prep HPF POC: NONE SEEN

## 2015-10-30 MED ORDER — KETOROLAC TROMETHAMINE 30 MG/ML IJ SOLN
30.0000 mg | Freq: Once | INTRAMUSCULAR | Status: AC
  Administered 2015-10-30: 30 mg via INTRAMUSCULAR
  Filled 2015-10-30: qty 1

## 2015-10-30 MED ORDER — STERILE WATER FOR INJECTION IJ SOLN
INTRAMUSCULAR | Status: AC
  Administered 2015-10-30: 0.9 mL
  Filled 2015-10-30: qty 10

## 2015-10-30 MED ORDER — CEFTRIAXONE SODIUM 250 MG IJ SOLR
250.0000 mg | Freq: Once | INTRAMUSCULAR | Status: AC
  Administered 2015-10-30: 250 mg via INTRAMUSCULAR
  Filled 2015-10-30: qty 250

## 2015-10-30 MED ORDER — CEPHALEXIN 500 MG PO CAPS: 500.0000 mg | ORAL_CAPSULE | Freq: Four times a day (QID) | ORAL | Status: DC

## 2015-10-30 MED ORDER — ONDANSETRON 4 MG PO TBDP
4.0000 mg | ORAL_TABLET | Freq: Once | ORAL | Status: AC
  Administered 2015-10-30: 4 mg via ORAL
  Filled 2015-10-30: qty 1

## 2015-10-30 MED ORDER — AZITHROMYCIN 250 MG PO TABS
1000.0000 mg | ORAL_TABLET | Freq: Once | ORAL | Status: AC
  Administered 2015-10-30: 1000 mg via ORAL
  Filled 2015-10-30: qty 4

## 2015-10-30 NOTE — ED Provider Notes (Signed)
CSN: [id number]     Arrival date & time 10/30/15  1640 History   First MD Initiated Contact with Patient 10/30/15 1647     Chief Complaint  Patient presents with  . Vaginal Discharge     (Consider location/radiation/quality/duration/timing/severity/associated sxs/prior Treatment) HPI Comments: 20 year old female presents for vaginal discharge, burning, swelling. The patient reports that she's been having vaginal itching. The patient has been working as a Public relations account executive and has been spending long.'s of time sitting in a wet bathing suit. She states that over the last few days she has had vaginal itching and to help with this she tried using Monistat. After placing the Monistat she developed what felt like swelling and pain in her vagina. She denies fevers, chills, abdominal pain, nausea, vomiting. Reports some pain with urination. No frequency or urgency.   Past Medical History  Diagnosis Date  . Anxiety   . PID (acute pelvic inflammatory disease)   . Ovarian cyst    History reviewed. No pertinent past surgical history. History reviewed. No pertinent family history. Social History  Substance Use Topics  . Smoking status: Current Every Day Smoker    Types: Cigarettes  . Smokeless tobacco: None  . Alcohol Use: No   OB History    No data available     Review of Systems  Constitutional: Negative for fever, chills and fatigue.  HENT: Negative for congestion, postnasal drip and rhinorrhea.   Eyes: Negative for visual disturbance.  Respiratory: Negative for cough, chest tightness and shortness of breath.   Cardiovascular: Negative for chest pain and palpitations.  Gastrointestinal: Negative for nausea, vomiting, abdominal pain and diarrhea.  Genitourinary: Positive for dysuria and vaginal discharge. Negative for urgency, frequency and vaginal bleeding.  Musculoskeletal: Negative for myalgias and back pain.  Skin: Negative for rash.  Neurological: Negative for dizziness, weakness,  light-headedness and headaches.  Hematological: Does not bruise/bleed easily.      Allergies  Review of patient's allergies indicates no known allergies.  Home Medications   Prior to Admission medications   Medication Sig Start Date End Date Taking? Authorizing Provider  ibuprofen (ADVIL,MOTRIN) 800 MG tablet Take 1 tablet (800 mg total) by mouth 3 (three) times daily. 05/14/15  Yes Taleyah Rose, PA-C  LORazepam (ATIVAN) 0.5 MG tablet Take 0.5 mg by mouth at bedtime as needed. 10/29/15  Yes Historical Provider, MD  sertraline (ZOLOFT) 100 MG tablet Take 150 mg by mouth daily. 10/29/15  Yes Historical Provider, MD  acetaminophen (TYLENOL) 500 MG tablet Take 1 tablet (500 mg total) by mouth every 6 (six) hours as needed. 05/14/15   Cheri Fowler, PA-C  cyclopentolate (CYCLOGYL) 1 % ophthalmic solution Place 1 drop into the right eye 3 (three) times daily. 05/19/15   Melene Plan, DO  doxycycline (VIBRAMYCIN) 100 MG capsule Take 1 capsule (100 mg total) by mouth 2 (two) times daily. One po bid x 14 days Patient not taking: Reported on 03/16/2015 03/02/15   Mercedes Camprubi-Soms, PA-C  HYDROcodone-acetaminophen (NORCO) 5-325 MG tablet Take 1 tablet by mouth every 6 (six) hours as needed for severe pain. Patient not taking: Reported on 03/07/2015 03/02/15   Mercedes Camprubi-Soms, PA-C  MONO-LINYAH 0.25-35 MG-MCG tablet Take 1 tablet by mouth daily. 10/29/15   Historical Provider, MD  naproxen (NAPROSYN) 500 MG tablet Take 1 tablet (500 mg total) by mouth 2 (two) times daily as needed for mild pain, moderate pain or headache (TAKE WITH MEALS.). Patient not taking: Reported on 03/16/2015 03/02/15   Mercedes Camprubi-Soms, PA-C  naproxen (NAPROSYN) 500 MG tablet Take 1 tablet (500 mg total) by mouth 2 (two) times daily as needed for mild pain, moderate pain or headache (TAKE WITH MEALS.). Patient not taking: Reported on 05/19/2015 03/07/15   Mercedes Camprubi-Soms, PA-C  ondansetron (ZOFRAN) 8 MG tablet Take 1 tablet  (8 mg total) by mouth every 8 (eight) hours as needed for nausea or vomiting. Patient not taking: Reported on 05/19/2015 03/02/15   Mercedes Camprubi-Soms, PA-C  oxyCODONE-acetaminophen (PERCOCET) 5-325 MG tablet Take 1 tablet by mouth every 6 (six) hours as needed for severe pain. Patient not taking: Reported on 03/16/2015 03/07/15   Mercedes Camprubi-Soms, PA-C  polyethylene glycol (MIRALAX / GLYCOLAX) packet Take 17 g by mouth daily. Patient not taking: Reported on 05/19/2015 03/07/15   Mercedes Camprubi-Soms, PA-C   BP 128/63 mmHg  Pulse 89  Temp(Src) 98.7 F (37.1 C) (Oral)  Resp 16  Wt 214 lb (97.07 kg)  SpO2 99% Physical Exam  Constitutional: She is oriented to person, place, and time. She appears well-developed and well-nourished. No distress.  HENT:  Head: Normocephalic and atraumatic.  Right Ear: External ear normal.  Left Ear: External ear normal.  Nose: Nose normal.  Mouth/Throat: Oropharynx is clear and moist. No oropharyngeal exudate.  Eyes: EOM are normal. Pupils are equal, round, and reactive to light.  Neck: Normal range of motion. Neck supple.  Cardiovascular: Normal rate, regular rhythm, normal heart sounds and intact distal pulses.   No murmur heard. Pulmonary/Chest: Effort normal. No respiratory distress. She has no wheezes. She has no rales.  Abdominal: Soft. She exhibits no distension. There is no tenderness.  Genitourinary: Cervix exhibits friability. Cervix exhibits no motion tenderness and no discharge. Right adnexum displays no mass, no tenderness and no fullness. Left adnexum displays no mass, no tenderness and no fullness. No bleeding in the vagina. Vaginal discharge (but with monistat present) found.  Musculoskeletal: Normal range of motion. She exhibits no edema or tenderness.  Neurological: She is alert and oriented to person, place, and time.  Skin: Skin is warm and dry. No rash noted. She is not diaphoretic.  Vitals reviewed.   ED Course  Procedures  (including critical care time) Labs Review Labs Reviewed  WET PREP, GENITAL - Abnormal; Notable for the following:    WBC, Wet Prep HPF POC MANY (*)    All other components within normal limits  URINALYSIS, ROUTINE W REFLEX MICROSCOPIC (NOT AT [facility]) - Abnormal; Notable for the following:    APPearance CLOUDY (*)    Ketones, ur 15 (*)    Leukocytes, UA LARGE (*)    All other components within normal limits  URINE MICROSCOPIC-ADD ON - Abnormal; Notable for the following:    Squamous Epithelial / LPF 0-5 (*)    Bacteria, UA MANY (*)    All other components within normal limits  URINE CULTURE  PREGNANCY, URINE  GC/CHLAMYDIA PROBE AMP ([facility]) NOT AT [facility]    Imaging Review No results found. I have personally reviewed and evaluated these images and lab results as part of my medical decision-making.   EKG Interpretation None      MDM  Patient was seen and evaluated in stable condition. Cervical cultures sent. Wet prep with wbc's but otherwise unremarkable. UA concerning for infection. Patient was treated with Rocephin and azithromycin. She was discharged with a prescription for Keflex. She was instructed to follow-up outpatient. She was also instructed not to use Monistat again. Final diagnoses:  None    1. UTI  2. Vaginal discomfort    Leta BaptistEmily Roe Burrel Legrand, MD 10/31/15 [phone]

## 2015-10-30 NOTE — Discharge Instructions (Signed)
You were seen and evaluated today for your vaginal discomfort. It appears you have a urinary tract infection which this is related to. He probably also had a reaction to the Monistat. Please do not use Monistat again. Take the antibiotics prescribed. You were also treated for STDs. Follow-up outpatient at the [facility] clinic if your symptoms persist.  Urinary Tract Infection Urinary tract infections (UTIs) can develop anywhere along your urinary tract. Your urinary tract is your body's drainage system for removing wastes and extra water. Your urinary tract includes two kidneys, two ureters, a bladder, and a urethra. Your kidneys are a pair of bean-shaped organs. Each kidney is about the size of your fist. They are located below your ribs, one on each side of your spine. CAUSES Infections are caused by microbes, which are microscopic organisms, including fungi, viruses, and bacteria. These organisms are so small that they can only be seen through a microscope. Bacteria are the microbes that most commonly cause UTIs. SYMPTOMS  Symptoms of UTIs may vary by age and gender of the patient and by the location of the infection. Symptoms in young women typically include a frequent and intense urge to urinate and a painful, burning feeling in the bladder or urethra during urination. Older women and men are more likely to be tired, shaky, and weak and have muscle aches and abdominal pain. A fever may mean the infection is in your kidneys. Other symptoms of a kidney infection include pain in your back or sides below the ribs, nausea, and vomiting. DIAGNOSIS To diagnose a UTI, your caregiver will ask you about your symptoms. Your caregiver will also ask you to provide a urine sample. The urine sample will be tested for bacteria and white blood cells. White blood cells are made by your body to help fight infection. TREATMENT  Typically, UTIs can be treated with medication. Because most UTIs are caused by a bacterial  infection, they usually can be treated with the use of antibiotics. The choice of antibiotic and length of treatment depend on your symptoms and the type of bacteria causing your infection. HOME CARE INSTRUCTIONS  If you were prescribed antibiotics, take them exactly as your caregiver instructs you. Finish the medication even if you feel better after you have only taken some of the medication.  Drink enough water and fluids to keep your urine clear or pale yellow.  Avoid caffeine, tea, and carbonated beverages. They tend to irritate your bladder.  Empty your bladder often. Avoid holding urine for long periods of time.  Empty your bladder before and after sexual intercourse.  After a bowel movement, women should cleanse from front to back. Use each tissue only once. SEEK MEDICAL CARE IF:   You have back pain.  You develop a fever.  Your symptoms do not begin to resolve within 3 days. SEEK IMMEDIATE MEDICAL CARE IF:   You have severe back pain or lower abdominal pain.  You develop chills.  You have nausea or vomiting.  You have continued burning or discomfort with urination. MAKE SURE YOU:   Understand these instructions.  Will watch your condition.  Will get help right away if you are not doing well or get worse.   This information is not intended to replace advice given to you by your health care provider. Make sure you discuss any questions you have with your health care provider.   Document Released: 01/22/2005 Document Revised: 01/03/2015 Document Reviewed: 05/23/2011 Elsevier Interactive Patient Education [employer]2016 [employer] Inc[employer]

## 2015-10-30 NOTE — ED Notes (Signed)
Pt sts vaginal discharge and swelling after using monistat treatment; pt sts increased pain and irritation; pt sts LMP was 3 weeks ago

## 2015-10-31 LAB — GC/CHLAMYDIA PROBE AMP (~~LOC~~) NOT AT ARMC
CHLAMYDIA, DNA PROBE: POSITIVE — AB
Neisseria Gonorrhea: POSITIVE — AB

## 2015-11-01 ENCOUNTER — Telehealth: Payer: Self-pay | Admitting: *Deleted

## 2015-11-01 LAB — URINE CULTURE

## 2015-11-01 NOTE — ED Notes (Signed)
Spoke with patient, verified ID, informed of labs, treated per protocol, DHHS form faxed, patient informed to abstain for sexual activity x 10 days and notify sexual partners for evaluation and treatment 

## 2015-12-18 ENCOUNTER — Encounter (HOSPITAL_COMMUNITY): Payer: Self-pay

## 2015-12-18 DIAGNOSIS — N72 Inflammatory disease of cervix uteri: Secondary | ICD-10-CM | POA: Insufficient documentation

## 2015-12-18 DIAGNOSIS — B373 Candidiasis of vulva and vagina: Secondary | ICD-10-CM | POA: Insufficient documentation

## 2015-12-18 DIAGNOSIS — F17210 Nicotine dependence, cigarettes, uncomplicated: Secondary | ICD-10-CM | POA: Insufficient documentation

## 2015-12-18 LAB — COMPREHENSIVE METABOLIC PANEL
ALBUMIN: 3.8 g/dL (ref 3.5–5.0)
ALK PHOS: 56 U/L (ref 38–126)
ALT: 20 U/L (ref 14–54)
ANION GAP: 6 (ref 5–15)
AST: 25 U/L (ref 15–41)
BUN: 8 mg/dL (ref 6–20)
CALCIUM: 9.4 mg/dL (ref 8.9–10.3)
CHLORIDE: 106 mmol/L (ref 101–111)
CO2: 25 mmol/L (ref 22–32)
Creatinine, Ser: 0.72 mg/dL (ref 0.44–1.00)
GFR calc non Af Amer: >60 mL/min (ref >60)
GLUCOSE: 94 mg/dL (ref 65–99)
Potassium: 3.5 mmol/L (ref 3.5–5.1)
SODIUM: 137 mmol/L (ref 135–145)
Total Bilirubin: 0.1 mg/dL — ABNORMAL LOW (ref 0.3–1.2)
Total Protein: 6.5 g/dL (ref 6.5–8.1)

## 2015-12-18 LAB — URINE MICROSCOPIC-ADD ON

## 2015-12-18 LAB — POC URINE PREG, ED: PREG TEST UR: NEGATIVE

## 2015-12-18 LAB — CBC
HEMATOCRIT: 40.5 % (ref 36.0–46.0)
HEMOGLOBIN: 13 g/dL (ref 12.0–15.0)
MCH: 28.6 pg (ref 26.0–34.0)
MCHC: 32.1 g/dL (ref 30.0–36.0)
MCV: 89.2 fL (ref 78.0–100.0)
Platelets: 287 10*3/uL (ref 150–400)
RBC: 4.54 MIL/uL (ref 3.87–5.11)
RDW: 14 % (ref 11.5–15.5)
WBC: 10.4 10*3/uL (ref 4.0–10.5)

## 2015-12-18 LAB — URINALYSIS, ROUTINE W REFLEX MICROSCOPIC
BILIRUBIN URINE: NEGATIVE
GLUCOSE, UA: NEGATIVE mg/dL
Ketones, ur: NEGATIVE mg/dL
Nitrite: NEGATIVE
PH: 6 (ref 5.0–8.0)
Protein, ur: NEGATIVE mg/dL
SPECIFIC GRAVITY, URINE: 1.03 (ref 1.005–1.030)

## 2015-12-18 LAB — LIPASE, BLOOD: LIPASE: 29 U/L (ref 11–51)

## 2015-12-18 NOTE — ED Triage Notes (Addendum)
Onset last night period stopped and then lower abdominal and vaginal pain started.  Painful when walking.  Feels like it is on the inside of vagina.  No vaginal discharge, fevers, urinary symptoms.

## 2015-12-19 ENCOUNTER — Emergency Department (HOSPITAL_COMMUNITY)
Admission: EM | Admit: 2015-12-19 | Discharge: 2015-12-19 | Disposition: A | Discharge status: Home / Self Care | Payer: 59 | Attending: Emergency Medicine | Admitting: Emergency Medicine

## 2015-12-19 DIAGNOSIS — B373 Candidiasis of vulva and vagina: Secondary | ICD-10-CM

## 2015-12-19 DIAGNOSIS — R102 Pelvic and perineal pain: Secondary | ICD-10-CM

## 2015-12-19 DIAGNOSIS — N72 Inflammatory disease of cervix uteri: Secondary | ICD-10-CM

## 2015-12-19 DIAGNOSIS — B3731 Acute candidiasis of vulva and vagina: Secondary | ICD-10-CM

## 2015-12-19 LAB — RPR: RPR Ser Ql: NONREACTIVE

## 2015-12-19 LAB — WET PREP, GENITAL
Clue Cells Wet Prep HPF POC: NONE SEEN
Sperm: NONE SEEN
TRICH WET PREP: NONE SEEN

## 2015-12-19 LAB — HIV ANTIBODY (ROUTINE TESTING W REFLEX): HIV Screen 4th Generation wRfx: NONREACTIVE

## 2015-12-19 LAB — GC/CHLAMYDIA PROBE AMP (~~LOC~~) NOT AT ARMC
CHLAMYDIA, DNA PROBE: NEGATIVE
Neisseria Gonorrhea: NEGATIVE

## 2015-12-19 MED ORDER — AZITHROMYCIN 250 MG PO TABS
1000.0000 mg | ORAL_TABLET | Freq: Once | ORAL | Status: AC
  Administered 2015-12-19: 1000 mg via ORAL
  Filled 2015-12-19: qty 4

## 2015-12-19 MED ORDER — FLUCONAZOLE 150 MG PO TABS: 150.0000 mg | ORAL_TABLET | ORAL | 0 refills | Status: AC

## 2015-12-19 MED ORDER — IBUPROFEN 800 MG PO TABS: 800.0000 mg | ORAL_TABLET | Freq: Three times a day (TID) | ORAL | 0 refills | Status: AC

## 2015-12-19 MED ORDER — TRAMADOL HCL 50 MG PO TABS: 50.0000 mg | ORAL_TABLET | Freq: Four times a day (QID) | ORAL | 0 refills | Status: AC | PRN

## 2015-12-19 MED ORDER — ONDANSETRON HCL 4 MG/2ML IJ SOLN
4.0000 mg | Freq: Once | INTRAMUSCULAR | Status: AC
  Administered 2015-12-19: 4 mg via INTRAVENOUS
  Filled 2015-12-19: qty 2

## 2015-12-19 MED ORDER — MORPHINE SULFATE (PF) 4 MG/ML IV SOLN
4.0000 mg | Freq: Once | INTRAVENOUS | Status: AC
  Administered 2015-12-19: 4 mg via INTRAVENOUS
  Filled 2015-12-19: qty 1

## 2015-12-19 MED ORDER — DEXTROSE 5 % IV SOLN
1.0000 g | Freq: Once | INTRAVENOUS | Status: AC
  Administered 2015-12-19: 1 g via INTRAVENOUS
  Filled 2015-12-19: qty 10

## 2015-12-19 MED ORDER — FLUCONAZOLE 100 MG PO TABS
150.0000 mg | ORAL_TABLET | Freq: Once | ORAL | Status: AC
  Administered 2015-12-19: 150 mg via ORAL
  Filled 2015-12-19: qty 2

## 2015-12-19 NOTE — ED Provider Notes (Signed)
[facility]-EMERGENCY DEPT Provider Note   CSN: [id number] Arrival date & time: 12/18/15  2154  By signing my name below, I, Majel HomerPeyton Lee, attest that this documentation has been prepared under the direction and in the presence of Gilda Creasehristopher J Emsley Custer, MD . Electronically Signed: Majel HomerPeyton Lee, Scribe. 12/19/2015. 3:50 AM.  History   Chief Complaint Chief Complaint  Patient presents with  . Vaginal Pain  . Abdominal Pain   The history is provided by the patient. No language interpreter was used.   HPI Comments: Tiffany Pennington is a 20 y.o. female with PMHx of PID and ovarian cyst, who presents to the Emergency Department complaining of gradually worsening, vaginal and suprapubic abdominal pain that began this morning. Pt reports she recently ended her menstrual cycle last night and states her vaginal pain began once her bleeding stopped. She denies any vaginal discharge.   Past Medical History:  Diagnosis Date  . Anxiety   . Ovarian cyst   . PID (acute pelvic inflammatory disease)     Patient Active Problem List   Diagnosis Date Noted  . Chronic constipation 03/16/2015  . 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 No pertinent surgical history.  OB History    No data available     Home Medications    Prior to Admission medications   Medication Sig Start Date End Date Taking? Authorizing Provider  acetaminophen (TYLENOL) 500 MG tablet Take 1 tablet (500 mg total) by mouth every 6 (six) hours as needed. Patient taking differently: Take 1,000 mg by mouth every 6 (six) hours as needed for mild pain.  05/14/15  Yes Benita Rose, PA-C  LORazepam (ATIVAN) 0.5 MG tablet Take 0.5 mg by mouth at bedtime as needed for anxiety.  10/29/15  Yes Historical Provider, MD  MONO-LINYAH 0.25-35 MG-MCG tablet Take 1 tablet by mouth daily. 10/29/15  Yes Historical Provider, MD  fluconazole (DIFLUCAN) 150 MG tablet Take 1 tablet (150 mg total) by mouth every 7 (seven) days. 12/19/15   Gilda Creasehristopher J  Mikalia Fessel, MD  ibuprofen (ADVIL,MOTRIN) 800 MG tablet Take 1 tablet (800 mg total) by mouth 3 (three) times daily. 12/19/15   Gilda Creasehristopher J Ursala Cressy, MD  traMADol (ULTRAM) 50 MG tablet Take 1 tablet (50 mg total) by mouth every 6 (six) hours as needed. 12/19/15   Gilda Creasehristopher J Rishith Siddoway, MD    Family History History reviewed. No pertinent family history.  Social History Social History  Substance Use Topics  . Smoking status: Current Every Day Smoker    Types: Cigarettes  . Smokeless tobacco: Never Used  . Alcohol use No   Allergies   Review of patient's allergies indicates no known allergies.  Review of Systems Review of Systems  Constitutional: Negative for fever.  Gastrointestinal: Positive for abdominal pain.  Genitourinary: Positive for vaginal pain. Negative for vaginal discharge.   Physical Exam Updated Vital Signs BP 129/62 (BP Location: Right Arm)   Pulse 77   Temp 98 F (36.7 C) (Oral)   Resp 19   LMP 12/14/2015   SpO2 100%   Physical Exam  Constitutional: She is oriented to person, place, and time. She appears well-developed and well-nourished. No distress.  HENT:  Head: Normocephalic and atraumatic.  Right Ear: Hearing normal.  Left Ear: Hearing normal.  Nose: Nose normal.  Mouth/Throat: Oropharynx is clear and moist and mucous membranes are normal.  Eyes: Conjunctivae and EOM are normal. Pupils are equal, round, and reactive to light.  Neck: Normal range of motion. Neck supple.  Cardiovascular: Regular rhythm, S1 normal and S2 normal.  Exam reveals no gallop and no friction rub.   No murmur heard. Pulmonary/Chest: Effort normal and breath sounds normal. No respiratory distress. She exhibits no tenderness.  Abdominal: Soft. Normal appearance and bowel sounds are normal. There is no hepatosplenomegaly. There is no tenderness. There is no rebound, no guarding, no tenderness at McBurney's point and negative Murphy's sign. No hernia.  Diffuse lower abdominal/pelvic  tenderness  Genitourinary: Cervix exhibits motion tenderness. Right adnexum displays no mass. Left adnexum displays no mass.  Genitourinary Comments: Chaperone was present for exam which was performed with no discomfort or complications.  Musculoskeletal: Normal range of motion.  Neurological: She is alert and oriented to person, place, and time. She has normal strength. No cranial nerve deficit or sensory deficit. Coordination normal. GCS eye subscore is 4. GCS verbal subscore is 5. GCS motor subscore is 6.  Skin: Skin is warm, dry and intact. No rash noted. No cyanosis.  Psychiatric: She has a normal mood and affect. Her speech is normal and behavior is normal. Thought content normal.  Nursing note and vitals reviewed.  ED Treatments / Results  Labs (all labs ordered are listed, but only abnormal results are displayed) Labs Reviewed  WET PREP, GENITAL - Abnormal; Notable for the following:       Result Value   Yeast Wet Prep HPF POC PRESENT (*)    WBC, Wet Prep HPF POC MANY (*)    All other components within normal limits  COMPREHENSIVE METABOLIC PANEL - Abnormal; Notable for the following:    Total Bilirubin 0.1 (*)    All other components within normal limits  URINALYSIS, ROUTINE W REFLEX MICROSCOPIC (NOT AT [facility]) - Abnormal; Notable for the following:    APPearance CLOUDY (*)    Hgb urine dipstick MODERATE (*)    Leukocytes, UA SMALL (*)    All other components within normal limits  URINE MICROSCOPIC-ADD ON - Abnormal; Notable for the following:    Squamous Epithelial / LPF 6-30 (*)    Bacteria, UA FEW (*)    All other components within normal limits  LIPASE, BLOOD  CBC  HIV ANTIBODY (ROUTINE TESTING)  RPR  POC URINE PREG, ED  GC/CHLAMYDIA PROBE AMP ([facility]) NOT AT [facility]    EKG  EKG Interpretation None      Radiology No results found.  Procedures Procedures  DIAGNOSTIC STUDIES:  Oxygen Saturation is 100% on RA, normal by my interpretation.    COORDINATION  OF CARE:  3:28 AM Discussed treatment plan with pt at bedside and pt agreed to plan.  Medications Ordered in ED Medications  morphine 4 MG/ML injection 4 mg (4 mg Intravenous Given 12/19/15 0420)  ondansetron (ZOFRAN) injection 4 mg (4 mg Intravenous Given 12/19/15 0418)  cefTRIAXone (ROCEPHIN) 1 g in dextrose 5 % 50 mL IVPB (0 g Intravenous Stopped 12/19/15 0730)  azithromycin (ZITHROMAX) tablet 1,000 mg (1,000 mg Oral Given 12/19/15 0658)  fluconazole (DIFLUCAN) tablet 150 mg (150 mg Oral Given 12/19/15 [id number])    Initial Impression / Assessment and Plan / ED Course  I have reviewed the triage vital signs and the nursing notes.  Pertinent labs & imaging results that were available during my care of the patient were reviewed by me and considered in my medical decision making (see chart for details).  Clinical Course   Patient presented with complaints of abdominal pain. Abdominal exam did not reveal any signs of acute surgical process. Pelvic exam  revealed cervical motion tenderness with slight discharge. Symptoms most consistent with pelvic infection. Patient treated empirically.  I personally performed the services described in this documentation, which was scribed in my presence. The recorded information has been reviewed and is accurate.   Final Clinical Impressions(s) / ED Diagnoses   Final diagnoses:  Pelvic pain in female  Cervicitis  Yeast infection involving the vagina and surrounding area    New Prescriptions Discharge Medication List as of 12/19/2015  7:09 AM    START taking these medications   Details  fluconazole (DIFLUCAN) 150 MG tablet Take 1 tablet (150 mg total) by mouth every 7 (seven) days., Starting Wed 12/19/2015, Print    ibuprofen (ADVIL,MOTRIN) 800 MG tablet Take 1 tablet (800 mg total) by mouth 3 (three) times daily., Starting Wed 12/19/2015, Print    traMADol (ULTRAM) 50 MG tablet Take 1 tablet (50 mg total) by mouth every 6 (six) hours as needed., Starting  Wed 12/19/2015, Print         Gilda Crease, MD 12/20/15 [phone]

## 2015-12-21 ENCOUNTER — Encounter (HOSPITAL_COMMUNITY): Payer: Self-pay

## 2015-12-21 ENCOUNTER — Emergency Department (HOSPITAL_COMMUNITY)
Admission: EM | Admit: 2015-12-21 | Discharge: 2015-12-22 | Disposition: A | Discharge status: Home / Self Care | Payer: Self-pay | Attending: Emergency Medicine | Admitting: Emergency Medicine

## 2015-12-21 DIAGNOSIS — R102 Pelvic and perineal pain: Secondary | ICD-10-CM | POA: Insufficient documentation

## 2015-12-21 DIAGNOSIS — R52 Pain, unspecified: Secondary | ICD-10-CM

## 2015-12-21 DIAGNOSIS — F17210 Nicotine dependence, cigarettes, uncomplicated: Secondary | ICD-10-CM | POA: Insufficient documentation

## 2015-12-21 LAB — URINALYSIS, ROUTINE W REFLEX MICROSCOPIC
BILIRUBIN URINE: NEGATIVE
GLUCOSE, UA: NEGATIVE mg/dL
HGB URINE DIPSTICK: NEGATIVE
KETONES UR: NEGATIVE mg/dL
Nitrite: NEGATIVE
PH: 6 (ref 5.0–8.0)
PROTEIN: NEGATIVE mg/dL
Specific Gravity, Urine: 1.022 (ref 1.005–1.030)

## 2015-12-21 LAB — URINE MICROSCOPIC-ADD ON

## 2015-12-21 LAB — CBC
HCT: 44.8 % (ref 36.0–46.0)
Hemoglobin: 14.4 g/dL (ref 12.0–15.0)
MCH: 28.9 pg (ref 26.0–34.0)
MCHC: 32.1 g/dL (ref 30.0–36.0)
MCV: 90 fL (ref 78.0–100.0)
PLATELETS: 296 10*3/uL (ref 150–400)
RBC: 4.98 MIL/uL (ref 3.87–5.11)
RDW: 14.1 % (ref 11.5–15.5)
WBC: 9 10*3/uL (ref 4.0–10.5)

## 2015-12-21 LAB — COMPREHENSIVE METABOLIC PANEL
ALBUMIN: 4.3 g/dL (ref 3.5–5.0)
ALK PHOS: 64 U/L (ref 38–126)
ALT: 25 U/L (ref 14–54)
ANION GAP: 7 (ref 5–15)
AST: 26 U/L (ref 15–41)
BUN: 7 mg/dL (ref 6–20)
CHLORIDE: 105 mmol/L (ref 101–111)
CO2: 27 mmol/L (ref 22–32)
CREATININE: 0.71 mg/dL (ref 0.44–1.00)
Calcium: 9.7 mg/dL (ref 8.9–10.3)
GFR calc non Af Amer: >60 mL/min (ref >60)
GLUCOSE: 96 mg/dL (ref 65–99)
POTASSIUM: 3.6 mmol/L (ref 3.5–5.1)
SODIUM: 139 mmol/L (ref 135–145)
Total Bilirubin: 0.4 mg/dL (ref 0.3–1.2)
Total Protein: 7.6 g/dL (ref 6.5–8.1)

## 2015-12-21 LAB — POC URINE PREG, ED: Preg Test, Ur: NEGATIVE

## 2015-12-21 LAB — LIPASE, BLOOD: LIPASE: 30 U/L (ref 11–51)

## 2015-12-21 NOTE — ED Triage Notes (Signed)
Pt reports she was seen here Wed for vaginal pain and dx with cervicitis and yeast infection. Pt was treated prophylactically for ghonorea and chlamydia but symptoms are no better.

## 2015-12-22 ENCOUNTER — Emergency Department (HOSPITAL_COMMUNITY): Payer: Self-pay

## 2015-12-22 MED ORDER — OXYCODONE-ACETAMINOPHEN 5-325 MG PO TABS
1.0000 | ORAL_TABLET | Freq: Once | ORAL | Status: AC
  Administered 2015-12-22: 1 via ORAL
  Filled 2015-12-22: qty 1

## 2015-12-22 NOTE — ED Provider Notes (Signed)
[facility]-EMERGENCY DEPT Provider Note   CSN: [id number] Arrival date & time: 12/21/15  1725     History   Chief Complaint Chief Complaint  Patient presents with  . Vaginal Pain    HPI Tiffany Pennington is a 20 y.o. female.  Patient with history of pelvic pain presents with complaint consistent with history. She was seen on 12/19/15 in the ED and discharged home with medications for pain management and referral to [facility]. She reports she could not get an appointment and was referred back to the ED. She also reports that Tramadol is not helping her pain. No fever, dysuria, discharge or abdominal pain.    The history is provided by the patient. No language interpreter was used.  Vaginal Pain  Pertinent negatives include no abdominal pain.    Past Medical History:  Diagnosis Date  . Anxiety   . Ovarian cyst   . PID (acute pelvic inflammatory disease)     Patient Active Problem List   Diagnosis Date Noted  . Chronic constipation 03/16/2015  . 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 No pertinent surgical history.  OB History    No data available       Home Medications    Prior to Admission medications   Medication Sig Start Date End Date Taking? Authorizing Provider  acetaminophen (TYLENOL) 500 MG tablet Take 1 tablet (500 mg total) by mouth every 6 (six) hours as needed. Patient taking differently: Take 1,000 mg by mouth every 6 (six) hours as needed for mild pain.  05/14/15   Cheri Fowler, PA-C  fluconazole (DIFLUCAN) 150 MG tablet Take 1 tablet (150 mg total) by mouth every 7 (seven) days. 12/19/15   Gilda Crease, MD  ibuprofen (ADVIL,MOTRIN) 800 MG tablet Take 1 tablet (800 mg total) by mouth 3 (three) times daily. 12/19/15   Gilda Crease, MD  LORazepam (ATIVAN) 0.5 MG tablet Take 0.5 mg by mouth at bedtime as needed for anxiety.  10/29/15   Historical Provider, MD  MONO-LINYAH 0.25-35 MG-MCG tablet Take 1 tablet by mouth daily. 10/29/15    Historical Provider, MD  traMADol (ULTRAM) 50 MG tablet Take 1 tablet (50 mg total) by mouth every 6 (six) hours as needed. 12/19/15   Gilda Crease, MD    Family History History reviewed. No pertinent family history.  Social History Social History  Substance Use Topics  . Smoking status: Current Every Day Smoker    Types: Cigarettes  . Smokeless tobacco: Never Used  . Alcohol use No     Allergies   Review of patient's allergies indicates no known allergies.   Review of Systems Review of Systems  Constitutional: Negative for chills and fever.  Gastrointestinal: Negative.  Negative for abdominal pain, constipation, diarrhea and vomiting.  Genitourinary: Positive for pelvic pain. Negative for dysuria, vaginal bleeding and vaginal discharge.  Musculoskeletal: Negative.  Negative for myalgias.  Neurological: Negative.  Negative for weakness and light-headedness.     Physical Exam Updated Vital Signs BP 128/68   Pulse 81   Temp 97.8 F (36.6 C) (Oral)   Resp 16   Ht 5\' 4"  (1.626 m)   Wt 77.1 kg   LMP 12/14/2015   SpO2 100%   BMI 29.18 kg/m   Physical Exam  Constitutional: She is oriented to person, place, and time. She appears well-developed and well-nourished.  Neck: Normal range of motion.  Pulmonary/Chest: Effort normal.  Abdominal: Soft. She exhibits no mass. There is no tenderness.  There is no guarding.  Genitourinary:  Genitourinary Comments: Pelvic exam performed 3 days ago - repeat exam deferred.  Musculoskeletal: Normal range of motion.  Neurological: She is alert and oriented to person, place, and time.  Skin: Skin is warm and dry.     ED Treatments / Results  Labs (all labs ordered are listed, but only abnormal results are displayed) Labs Reviewed  URINALYSIS, ROUTINE W REFLEX MICROSCOPIC (NOT [facility]) - Abnormal; Notable for the following:       Result Value   Leukocytes, UA MODERATE (*)    All other components within normal limits    URINE MICROSCOPIC-ADD ON - Abnormal; Notable for the following:    Squamous Epithelial / LPF 6-30 (*)    Bacteria, UA FEW (*)    All other components within normal limits  LIPASE, BLOOD  COMPREHENSIVE METABOLIC PANEL  CBC  POC URINE PREG, ED   Results for orders placed or performed during the hospital encounter of 12/21/15  Lipase, blood  Result Value Ref Range   Lipase 30 11 - 51 U/L  Comprehensive metabolic panel  Result Value Ref Range   Sodium 139 135 - 145 mmol/L   Potassium 3.6 3.5 - 5.1 mmol/L   Chloride 105 101 - 111 mmol/L   CO2 27 22 - 32 mmol/L   Glucose, Bld 96 65 - 99 mg/dL   BUN 7 6 - 20 mg/dL   Creatinine, Ser 0.980.71 0.44 - 1.00 mg/dL   Calcium 9.7 8.9 - 11.910.3 mg/dL   Total Protein 7.6 6.5 - 8.1 g/dL   Albumin 4.3 3.5 - 5.0 g/dL   AST 26 15 - 41 U/L   ALT 25 14 - 54 U/L   Alkaline Phosphatase 64 38 - 126 U/L   Total Bilirubin 0.4 0.3 - 1.2 mg/dL   GFR calc non Af Amer >60 >60 mL/min   GFR calc Af Amer >60 >60 mL/min   Anion gap 7 5 - 15  CBC  Result Value Ref Range   WBC 9.0 4.0 - 10.5 K/uL   RBC 4.98 3.87 - 5.11 MIL/uL   Hemoglobin 14.4 12.0 - 15.0 g/dL   HCT 14.744.8 82.936.0 - 56.246.0 %   MCV 90.0 78.0 - 100.0 fL   MCH 28.9 26.0 - 34.0 pg   MCHC 32.1 30.0 - 36.0 g/dL   RDW 13.014.1 86.511.5 - 78.415.5 %   Platelets 296 150 - 400 K/uL  Urinalysis, Routine w reflex microscopic  Result Value Ref Range   Color, Urine YELLOW YELLOW   APPearance CLEAR CLEAR   Specific Gravity, Urine 1.022 1.005 - 1.030   pH 6.0 5.0 - 8.0   Glucose, UA NEGATIVE NEGATIVE mg/dL   Hgb urine dipstick NEGATIVE NEGATIVE   Bilirubin Urine NEGATIVE NEGATIVE   Ketones, ur NEGATIVE NEGATIVE mg/dL   Protein, ur NEGATIVE NEGATIVE mg/dL   Nitrite NEGATIVE NEGATIVE   Leukocytes, UA MODERATE (A) NEGATIVE  Urine microscopic-add on  Result Value Ref Range   Squamous Epithelial / LPF 6-30 (A) NONE SEEN   WBC, UA 6-30 0 - 5 WBC/hpf   RBC / HPF 0-5 0 - 5 RBC/hpf   Bacteria, UA FEW (A) NONE SEEN    Urine-Other MUCOUS PRESENT   POC urine preg, ED  Result Value Ref Range   Preg Test, Ur NEGATIVE NEGATIVE   [place] Transvaginal Non-ob  Result Date: 12/22/2015 CLINICAL DATA:  Pelvic pain for 3 days. EXAM: TRANSABDOMINAL AND TRANSVAGINAL ULTRASOUND OF PELVIS TECHNIQUE: Both transabdominal and  transvaginal ultrasound examinations of the pelvis were performed. Transabdominal technique was performed for global imaging of the pelvis including uterus, ovaries, adnexal regions, and pelvic cul-de-sac. It was necessary to proceed with endovaginal exam following the transabdominal exam to visualize the endometrium, ovaries and adnexa. COMPARISON:  Pelvic ultrasound 03/01/2016 FINDINGS: Uterus Measurements: 5.9 x 3.2 x 4.0 cm. No fibroids or other mass visualized. Endometrium Thickness: 4.8 mm.  No focal abnormality visualized. Right ovary Measurements: 3.6 x 2.0 x 2.7 cm. Normal appearance, normal blood flow. No adnexal mass. Left ovary Measurements: 3.8 x 2.5 x 3.1 cm. Normal appearance with physiologic follicles, largest measuring 1.4 cm. Normal blood flow. No adnexal mass. Other findings Small to moderate fluid in the pelvis appears similar to prior pelvic ultrasound, appears simple. IMPRESSION: 1. Normal sonographic appearance of the ovaries and uterus. Follicular cysts in the left ovary are physiologic and need no further follow-up. 2. Small to moderate free fluid in the pelvis appear similar to prior exam. Electronically Signed   By: Rubye Oaks M.D.   On: 12/22/2015 02:03   US Pelvis Complete  Result Date: 12/22/2015 CLINICAL DATA:  Pelvic pain for 3 days. EXAM: TRANSABDOMINAL AND TRANSVAGINAL ULTRASOUND OF PELVIS TECHNIQUE: Both transabdominal and transvaginal ultrasound examinations of the pelvis were performed. Transabdominal technique was performed for global imaging of the pelvis including uterus, ovaries, adnexal regions, and pelvic cul-de-sac. It was necessary to proceed with endovaginal exam  following the transabdominal exam to visualize the endometrium, ovaries and adnexa. COMPARISON:  Pelvic ultrasound 03/01/2016 FINDINGS: Uterus Measurements: 5.9 x 3.2 x 4.0 cm. No fibroids or other mass visualized. Endometrium Thickness: 4.8 mm.  No focal abnormality visualized. Right ovary Measurements: 3.6 x 2.0 x 2.7 cm. Normal appearance, normal blood flow. No adnexal mass. Left ovary Measurements: 3.8 x 2.5 x 3.1 cm. Normal appearance with physiologic follicles, largest measuring 1.4 cm. Normal blood flow. No adnexal mass. Other findings Small to moderate fluid in the pelvis appears similar to prior pelvic ultrasound, appears simple. IMPRESSION: 1. Normal sonographic appearance of the ovaries and uterus. Follicular cysts in the left ovary are physiologic and need no further follow-up. 2. Small to moderate free fluid in the pelvis appear similar to prior exam. Electronically Signed   By: Rubye Oaks M.D.   On: 12/22/2015 02:03    EKG  EKG Interpretation None       Radiology No results found.  Procedures Procedures (including critical care time)  Medications Ordered in ED Medications  oxyCODONE-acetaminophen (PERCOCET/ROXICET) 5-325 MG per tablet 1 tablet (1 tablet Oral Given 12/22/15 0112)     Initial Impression / Assessment and Plan / ED Course  I have reviewed the triage vital signs and the nursing notes.  Pertinent labs & imaging results that were available during my care of the patient were reviewed by me and considered in my medical decision making (see chart for details).  Clinical Course    Patient presents with recurrent symptoms of bilateral pelvic pain. Multiple similar presentations. VSS. Pelvic US unchanged from previous, without acute change. Patient can be discharged home with another referral to [facility]. Discussed continuation of Tramadol and ibuprofen.  Final Clinical Impressions(s) / ED Diagnoses   Final diagnoses:  Pain   1. Pelvic  pain, chronic  New Prescriptions New Prescriptions   No medications on file     Elpidio Anis, PA-C 12/22/15 0304    Melene Plan, DO 12/22/15 [phone]

## 2017-06-23 IMAGING — US US PELVIS COMPLETE
1 series · 13 of 25 positions shown · non-contrast
Comparison: Pelvic ultrasound 03/01/2016

CLINICAL DATA: Pelvic pain for 3 days.

EXAM:
TRANSABDOMINAL AND TRANSVAGINAL ULTRASOUND OF PELVIS
TECHNIQUE: Both transabdominal and transvaginal ultrasound examinations of the
pelvis were performed. Transabdominal technique was performed for
global imaging of the pelvis including uterus, ovaries, adnexal
regions, and pelvic cul-de-sac. It was necessary to proceed with
endovaginal exam following the transabdominal exam to visualize the
endometrium, ovaries and adnexa.

[Series 1: us pelvis complete · 0.22mm/px · 13 of 57 slices shown]
[im 1/57]
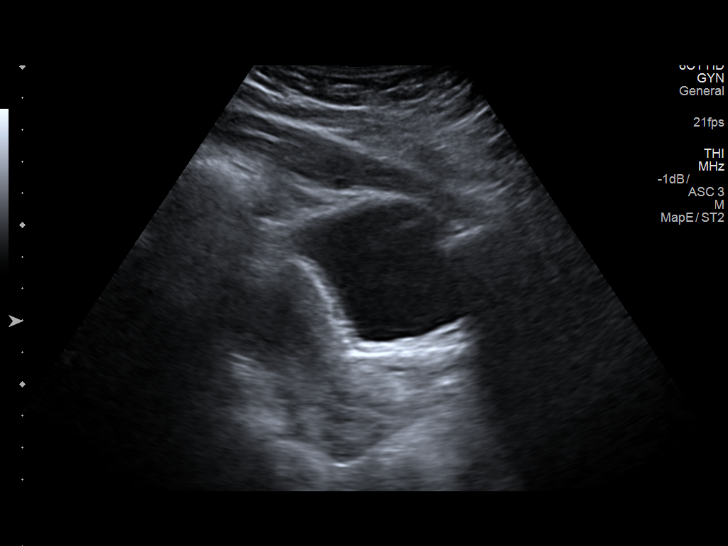
[im 5/57]
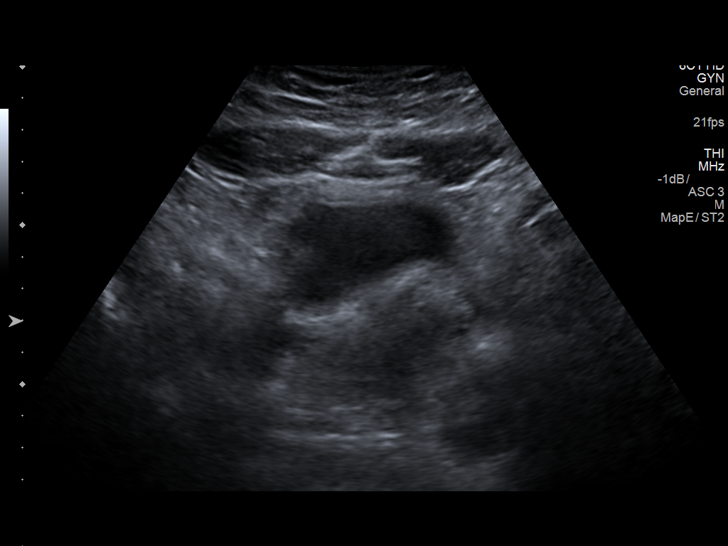
[im 10/57]
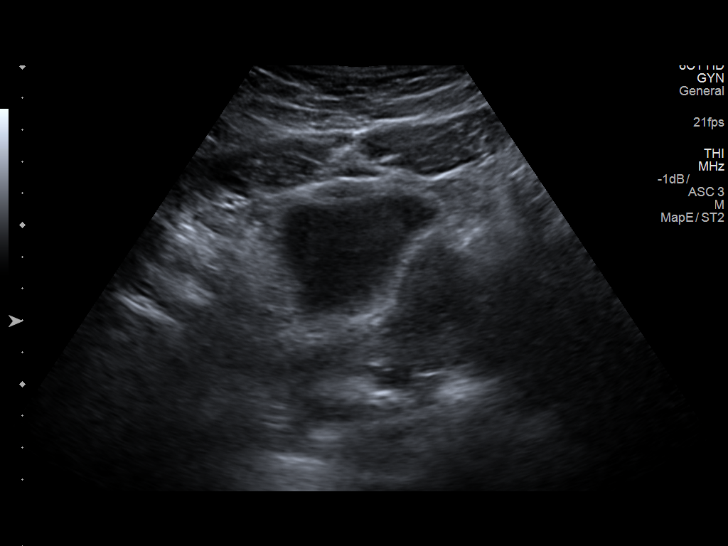
[im 15/57]
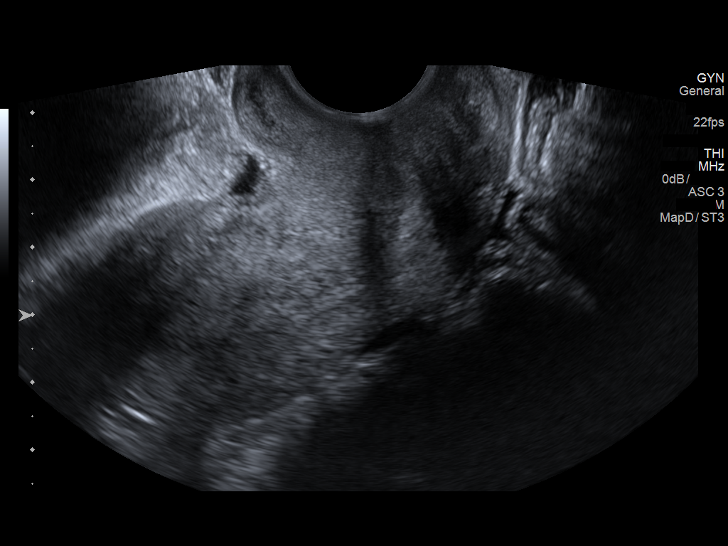
[im 19/57]
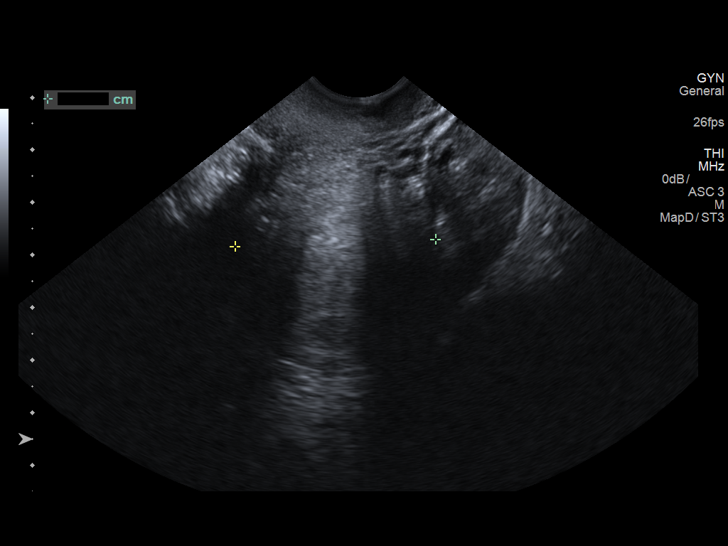
[im 24/57]
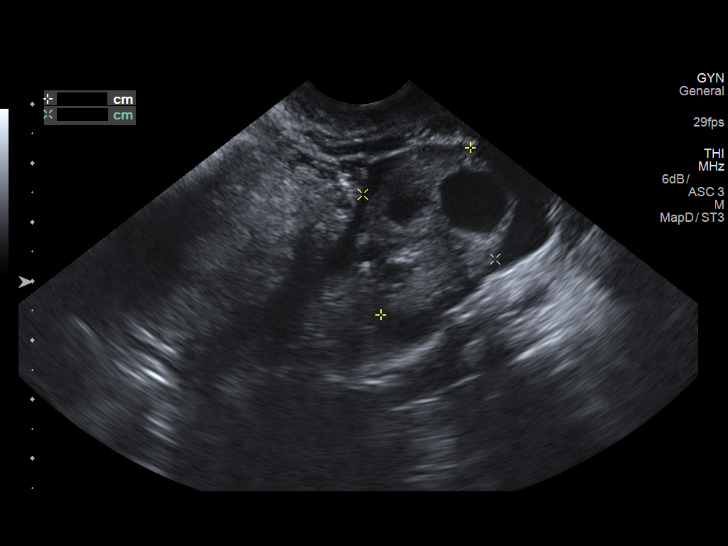
[im 29/57]
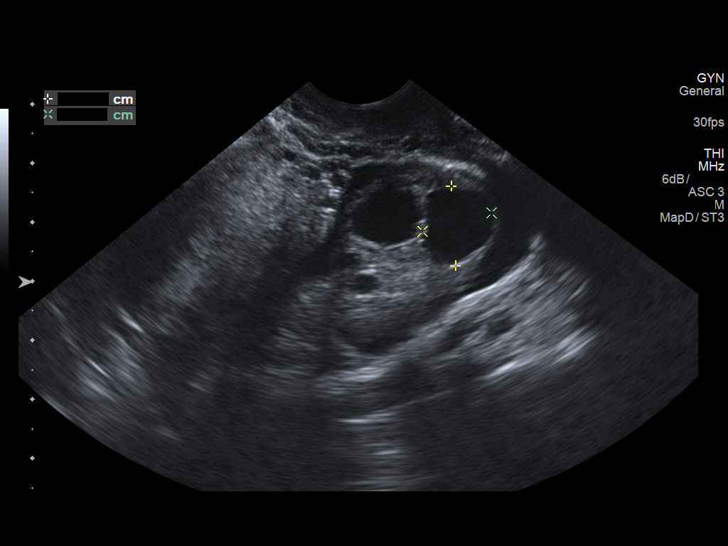
[im 33/57]
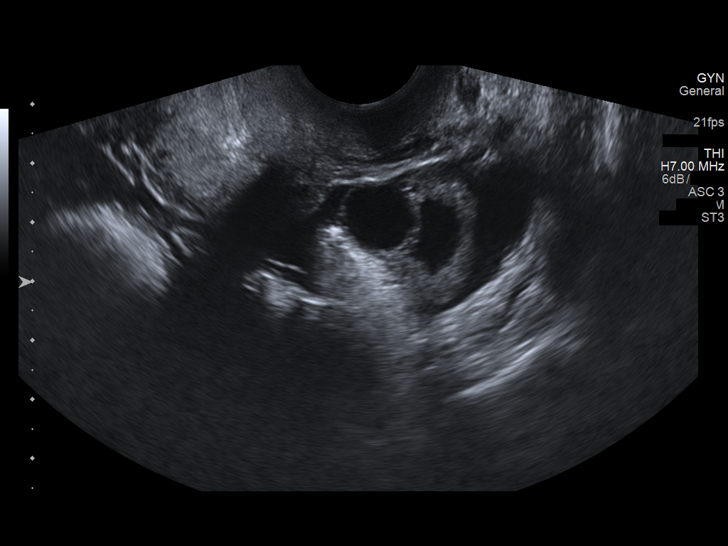
[im 38/57]
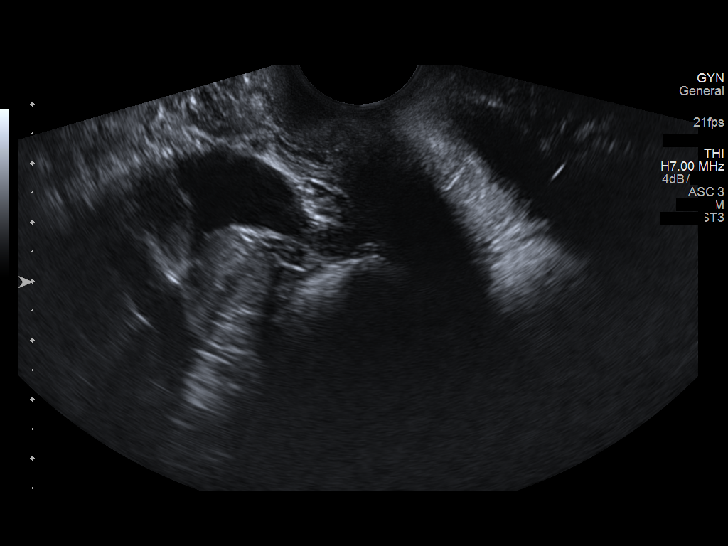
[im 43/57]
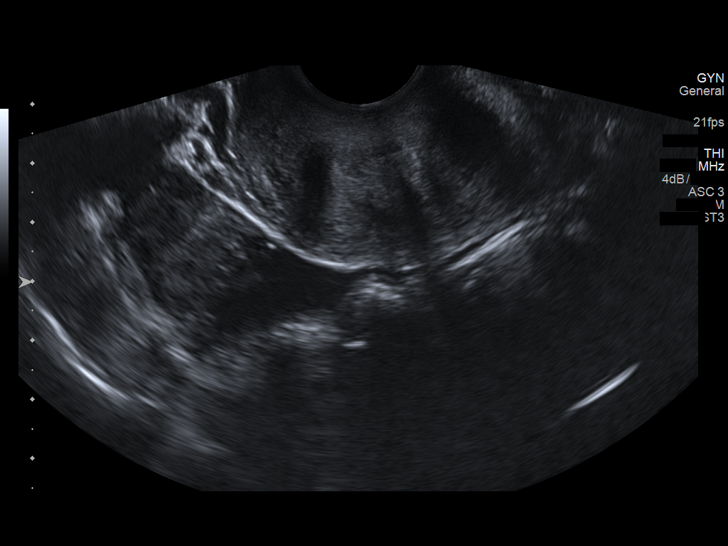
[im 47/57]
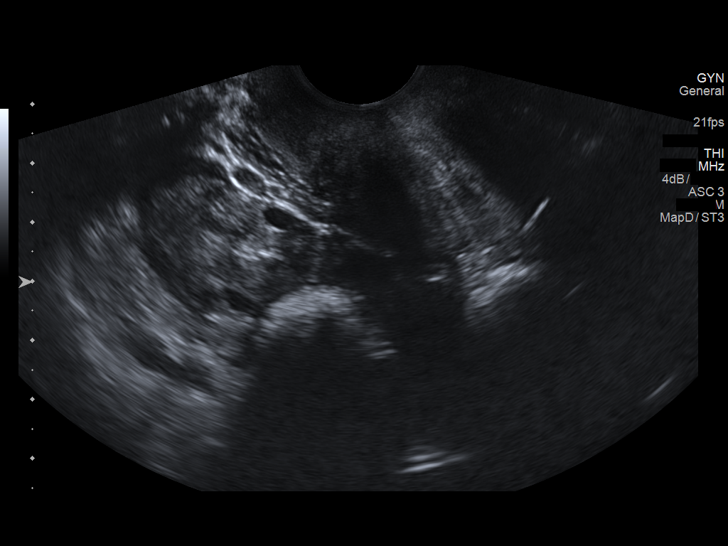
[im 52/57]
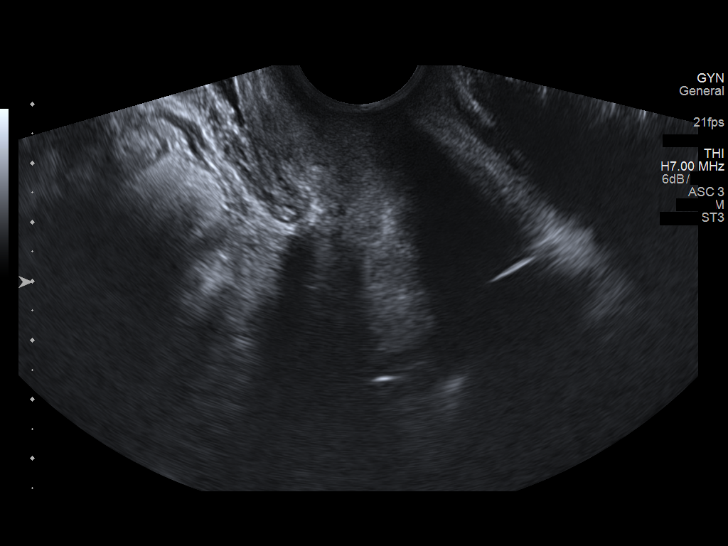
[im 57/57]
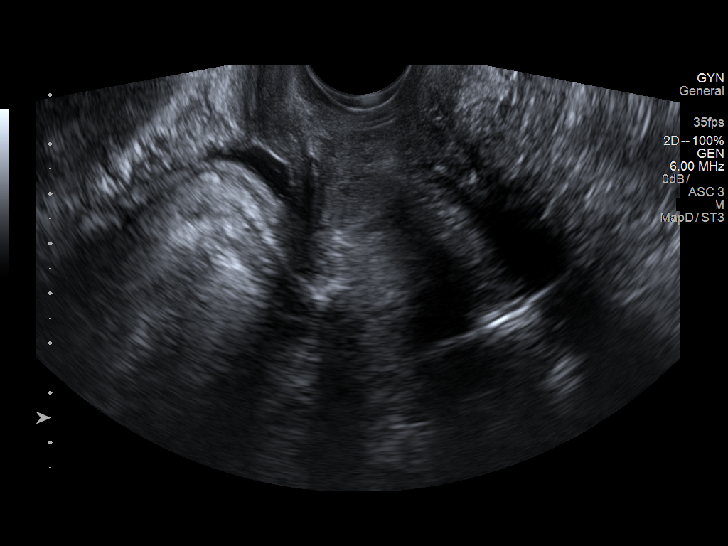

[13 of 25 positions shown; findings below may reference images not displayed]

FINDINGS: Uterus

Measurements: 5.9 x 3.2 x 4.0 cm. No fibroids or other mass
visualized.

Endometrium

Thickness: 4.8 mm.  No focal abnormality visualized.

Right ovary

Measurements: 3.6 x 2.0 x 2.7 cm. Normal appearance, normal blood
flow. No adnexal mass.

Left ovary

Measurements: 3.8 x 2.5 x 3.1 cm. Normal appearance with physiologic
follicles, largest measuring 1.4 cm. Normal blood flow. No adnexal
mass.

Other findings

Small to moderate fluid in the pelvis appears similar to prior
pelvic ultrasound, appears simple.
IMPRESSION: 1. Normal sonographic appearance of the ovaries and uterus.
Follicular cysts in the left ovary are physiologic and need no
further follow-up.
2. Small to moderate free fluid in the pelvis appear similar to
prior exam.
